# Patient Record
Sex: Female | Born: 1955 | Race: White | Hispanic: No | Marital: Married | Smoking: Current every day smoker
Health system: Southern US, Community
[De-identification: ages and names within clinical notes are randomized; demographics above are authoritative.]

## PROBLEM LIST (undated history)

## (undated) DIAGNOSIS — I10 Essential (primary) hypertension: Secondary | ICD-10-CM

## (undated) DIAGNOSIS — G629 Polyneuropathy, unspecified: Secondary | ICD-10-CM

## (undated) DIAGNOSIS — G4730 Sleep apnea, unspecified: Secondary | ICD-10-CM

## (undated) DIAGNOSIS — F419 Anxiety disorder, unspecified: Secondary | ICD-10-CM

## (undated) DIAGNOSIS — E119 Type 2 diabetes mellitus without complications: Secondary | ICD-10-CM

## (undated) HISTORY — PX: ABDOMINAL HYSTERECTOMY: SHX81

## (undated) HISTORY — PX: ROTATOR CUFF REPAIR: SHX139

---

## 1999-09-14 ENCOUNTER — Encounter: Payer: Self-pay | Admitting: *Deleted

## 1999-09-15 ENCOUNTER — Inpatient Hospital Stay (HOSPITAL_COMMUNITY): Admission: RE | Admit: 1999-09-15 | Discharge: 1999-09-17 | Payer: Self-pay | Admitting: *Deleted

## 1999-09-15 ENCOUNTER — Encounter (INDEPENDENT_AMBULATORY_CARE_PROVIDER_SITE_OTHER): Payer: Self-pay | Admitting: Specialist

## 2001-03-07 ENCOUNTER — Encounter: Payer: Self-pay | Admitting: Internal Medicine

## 2001-03-07 ENCOUNTER — Encounter: Admission: RE | Admit: 2001-03-07 | Discharge: 2001-03-07 | Payer: Self-pay | Admitting: Internal Medicine

## 2001-03-20 ENCOUNTER — Ambulatory Visit (HOSPITAL_BASED_OUTPATIENT_CLINIC_OR_DEPARTMENT_OTHER): Admission: RE | Admit: 2001-03-20 | Discharge: 2001-03-20 | Payer: Self-pay | Admitting: Geriatric Medicine

## 2001-08-26 ENCOUNTER — Other Ambulatory Visit: Admission: RE | Admit: 2001-08-26 | Discharge: 2001-08-26 | Payer: Self-pay | Admitting: *Deleted

## 2001-09-10 ENCOUNTER — Encounter: Payer: Self-pay | Admitting: Geriatric Medicine

## 2001-09-10 ENCOUNTER — Encounter: Admission: RE | Admit: 2001-09-10 | Discharge: 2001-09-10 | Payer: Self-pay | Admitting: Geriatric Medicine

## 2003-05-07 ENCOUNTER — Other Ambulatory Visit: Admission: RE | Admit: 2003-05-07 | Discharge: 2003-05-07 | Payer: Self-pay | Admitting: *Deleted

## 2010-06-21 ENCOUNTER — Ambulatory Visit (HOSPITAL_COMMUNITY): Admission: RE | Admit: 2010-06-21 | Discharge: 2010-06-21 | Payer: Self-pay | Admitting: Geriatric Medicine

## 2010-07-06 ENCOUNTER — Ambulatory Visit (HOSPITAL_COMMUNITY): Admission: RE | Admit: 2010-07-06 | Discharge: 2010-07-06 | Payer: Self-pay | Admitting: Geriatric Medicine

## 2012-06-03 ENCOUNTER — Encounter (HOSPITAL_COMMUNITY): Payer: Self-pay | Admitting: *Deleted

## 2012-06-03 ENCOUNTER — Emergency Department (HOSPITAL_COMMUNITY): Payer: Self-pay

## 2012-06-03 ENCOUNTER — Observation Stay (HOSPITAL_COMMUNITY)
Admission: EM | Admit: 2012-06-03 | Discharge: 2012-06-04 | Disposition: A | Discharge status: Home / Self Care | Payer: Self-pay | Attending: Internal Medicine | Admitting: Internal Medicine

## 2012-06-03 DIAGNOSIS — E669 Obesity, unspecified: Secondary | ICD-10-CM | POA: Insufficient documentation

## 2012-06-03 DIAGNOSIS — E119 Type 2 diabetes mellitus without complications: Secondary | ICD-10-CM | POA: Insufficient documentation

## 2012-06-03 DIAGNOSIS — I10 Essential (primary) hypertension: Secondary | ICD-10-CM | POA: Insufficient documentation

## 2012-06-03 DIAGNOSIS — R079 Chest pain, unspecified: Principal | ICD-10-CM

## 2012-06-03 HISTORY — DX: Anxiety disorder, unspecified: F41.9

## 2012-06-03 HISTORY — DX: Essential (primary) hypertension: I10

## 2012-06-03 HISTORY — DX: Polyneuropathy, unspecified: G62.9

## 2012-06-03 HISTORY — DX: Type 2 diabetes mellitus without complications: E11.9

## 2012-06-03 LAB — TROPONIN I
Troponin I: <0.3 ng/mL (ref <0.30)
Troponin I: <0.3 ng/mL (ref <0.30)
Troponin I: <0.3 ng/mL (ref <0.30)

## 2012-06-03 LAB — CBC WITH DIFFERENTIAL/PLATELET
Basophils Absolute: 0 10*3/uL (ref 0.0–0.1)
Basophils Relative: 1 % (ref 0–1)
Eosinophils Absolute: 0.2 10*3/uL (ref 0.0–0.7)
Eosinophils Relative: 2 % (ref 0–5)
HCT: 44.7 % (ref 36.0–46.0)
Hemoglobin: 15.5 g/dL — ABNORMAL HIGH (ref 12.0–15.0)
Lymphocytes Relative: 39 % (ref 12–46)
Lymphs Abs: 2.4 10*3/uL (ref 0.7–4.0)
MCH: 32.3 pg (ref 26.0–34.0)
MCHC: 34.7 g/dL (ref 30.0–36.0)
MCV: 93.1 fL (ref 78.0–100.0)
Monocytes Absolute: 0.4 10*3/uL (ref 0.1–1.0)
Monocytes Relative: 7 % (ref 3–12)
Neutro Abs: 3.3 10*3/uL (ref 1.7–7.7)
Neutrophils Relative %: 52 % (ref 43–77)
Platelets: 230 10*3/uL (ref 150–400)
RBC: 4.8 MIL/uL (ref 3.87–5.11)
RDW: 13.4 % (ref 11.5–15.5)
WBC: 6.3 10*3/uL (ref 4.0–10.5)

## 2012-06-03 LAB — BASIC METABOLIC PANEL
BUN: 17 mg/dL (ref 6–23)
CO2: 27 mEq/L (ref 19–32)
Calcium: 9.7 mg/dL (ref 8.4–10.5)
Chloride: 100 mEq/L (ref 96–112)
Creatinine, Ser: 0.77 mg/dL (ref 0.50–1.10)
GFR calc Af Amer: >90 mL/min (ref >90)
GFR calc non Af Amer: >90 mL/min (ref >90)
Glucose, Bld: 118 mg/dL — ABNORMAL HIGH (ref 70–99)
Potassium: 4 mEq/L (ref 3.5–5.1)
Sodium: 139 mEq/L (ref 135–145)

## 2012-06-03 LAB — GLUCOSE, CAPILLARY
Glucose-Capillary: 127 mg/dL — ABNORMAL HIGH (ref 70–99)
Glucose-Capillary: 108 mg/dL — ABNORMAL HIGH (ref 70–99)
Glucose-Capillary: 107 mg/dL — ABNORMAL HIGH (ref 70–99)

## 2012-06-03 IMAGING — CR DG CHEST 2V
3 series · 3 of 3 positions shown · non-contrast
Comparison: None.

CLINICAL DATA: Chest pain.

CHEST - 2 VIEW

[view not recorded (1 of 3)]
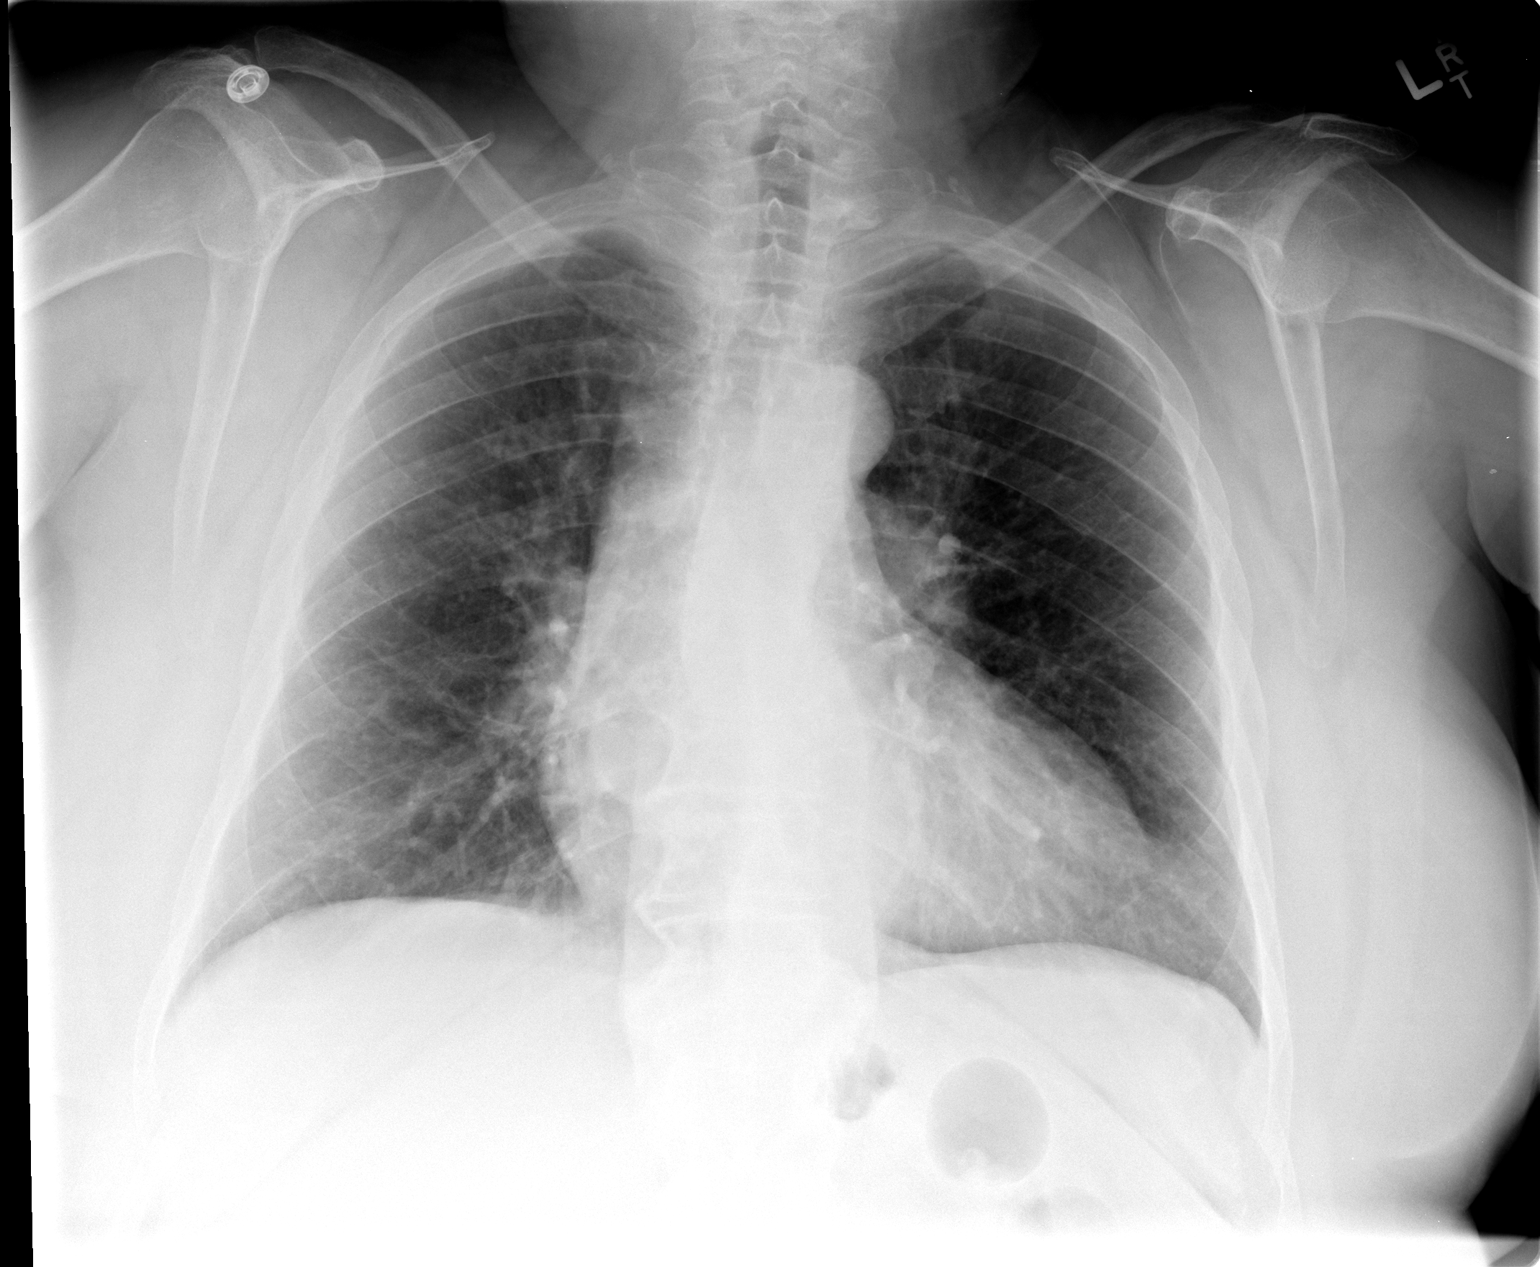

[view not recorded (2 of 3)]
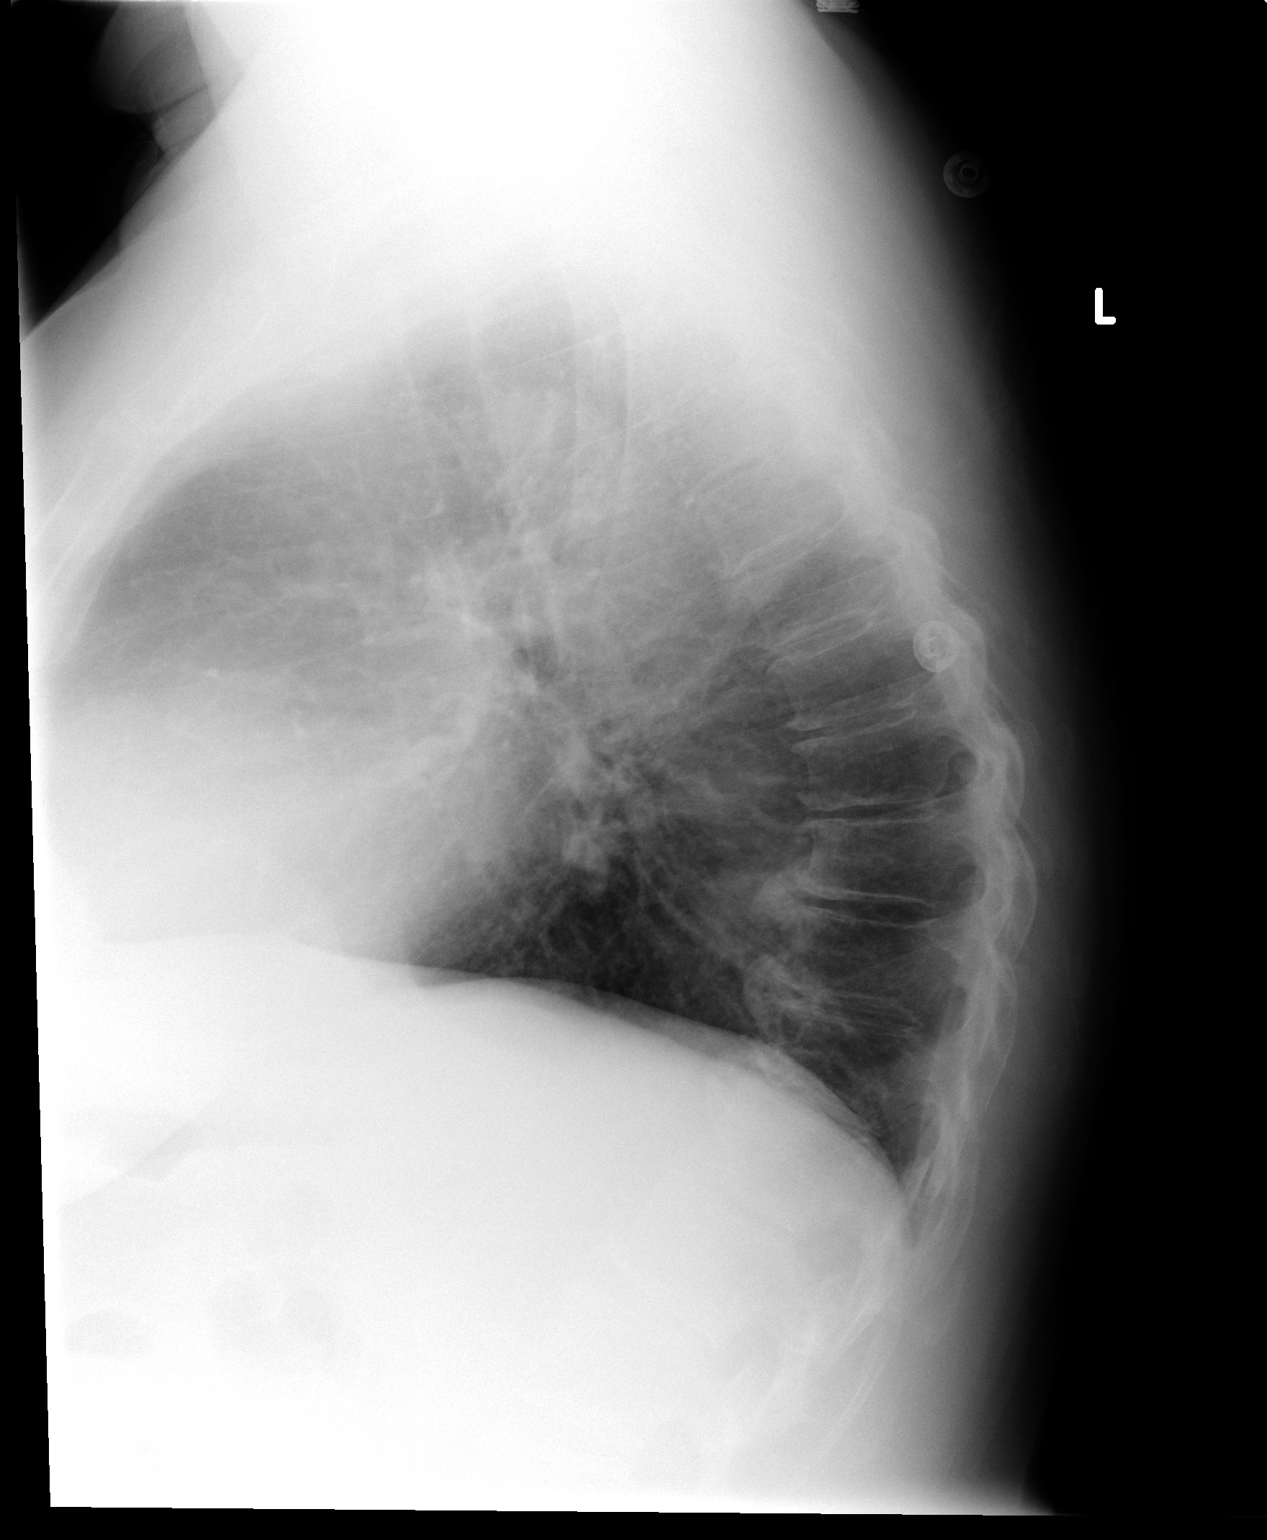

[view not recorded (3 of 3)]
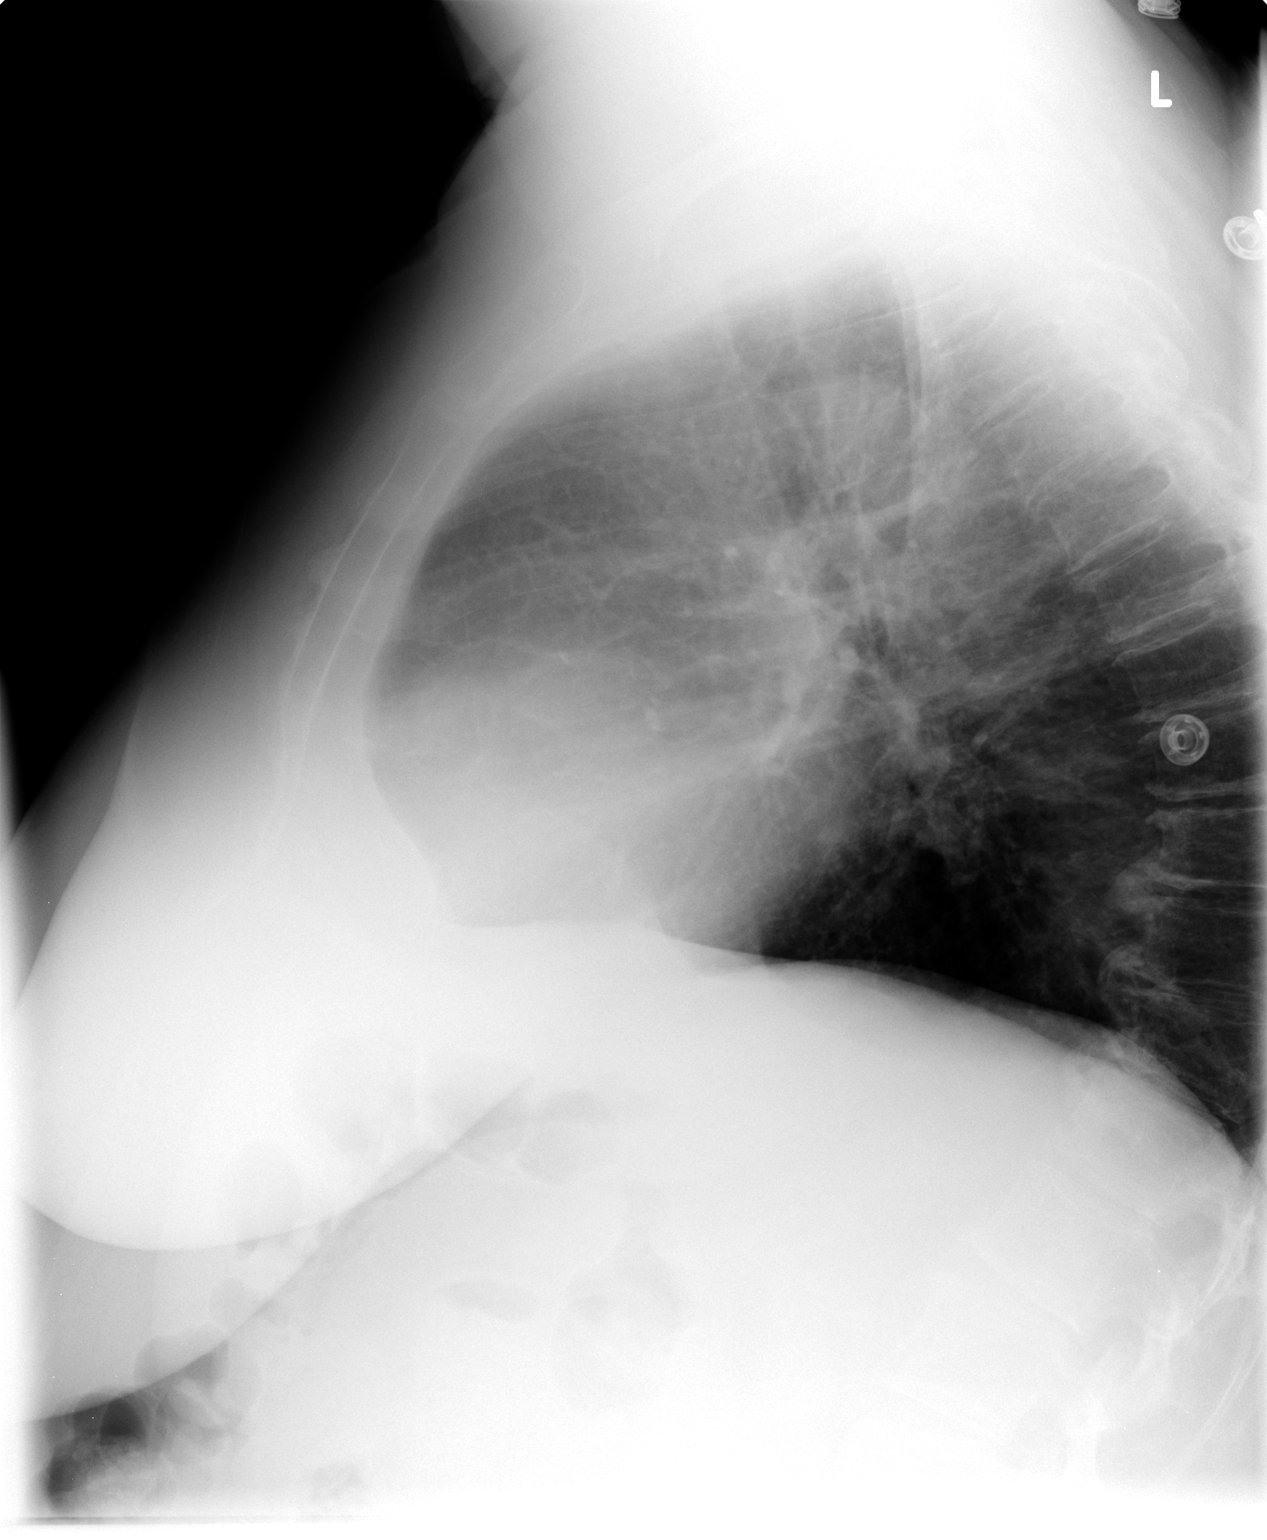

[3 of 3 positions shown; findings below may reference images not displayed]

FINDINGS: Cardiomediastinal silhouette appears normal.  No acute
pulmonary disease is noted.  Bony thorax is intact.
IMPRESSION: No acute cardiopulmonary abnormality seen.

## 2012-06-03 MED ORDER — ASPIRIN 81 MG PO CHEW: 81.0000 mg | CHEWABLE_TABLET | Freq: Every day | ORAL | Status: DC

## 2012-06-03 MED ORDER — GABAPENTIN 300 MG PO CAPS
300.0000 mg | ORAL_CAPSULE | Freq: Two times a day (BID) | ORAL | Status: DC
Start: 2012-06-03 — End: 2012-06-04
  Administered 2012-06-03 – 2012-06-04 (×3): 300 mg via ORAL
  Filled 2012-06-03 (×2): qty 1

## 2012-06-03 MED ORDER — ONDANSETRON HCL 4 MG/2ML IJ SOLN: 4.0000 mg | Freq: Four times a day (QID) | INTRAMUSCULAR | Status: DC | PRN

## 2012-06-03 MED ORDER — ONDANSETRON HCL 4 MG PO TABS: 4.0000 mg | ORAL_TABLET | Freq: Four times a day (QID) | ORAL | Status: DC | PRN

## 2012-06-03 MED ORDER — ASPIRIN 81 MG PO CHEW
243.0000 mg | CHEWABLE_TABLET | Freq: Once | ORAL | Status: AC
  Administered 2012-06-03: 243 mg via ORAL

## 2012-06-03 MED ORDER — TRAZODONE HCL 50 MG PO TABS
50.0000 mg | ORAL_TABLET | Freq: Every evening | ORAL | Status: DC | PRN
  Administered 2012-06-03: 50 mg via ORAL
  Filled 2012-06-03: qty 1

## 2012-06-03 MED ORDER — SODIUM CHLORIDE 0.9 % IJ SOLN
3.0000 mL | Freq: Two times a day (BID) | INTRAMUSCULAR | Status: DC
  Administered 2012-06-03: 3 mL via INTRAVENOUS
  Filled 2012-06-03: qty 3

## 2012-06-03 MED ORDER — HYDROCHLOROTHIAZIDE 25 MG PO TABS
25.0000 mg | ORAL_TABLET | Freq: Every day | ORAL | Status: DC
  Administered 2012-06-04: 25 mg via ORAL

## 2012-06-03 MED ORDER — METFORMIN HCL 500 MG PO TABS
1000.0000 mg | ORAL_TABLET | Freq: Two times a day (BID) | ORAL | Status: DC
  Administered 2012-06-03 – 2012-06-04 (×2): 1000 mg via ORAL
  Filled 2012-06-03: qty 2

## 2012-06-03 MED ORDER — CLONAZEPAM 0.5 MG PO TABS
1.0000 mg | ORAL_TABLET | Freq: Every day | ORAL | Status: DC
  Administered 2012-06-04: 1 mg via ORAL
  Filled 2012-06-03: qty 2

## 2012-06-03 MED ORDER — ASPIRIN 81 MG PO CHEW
324.0000 mg | CHEWABLE_TABLET | Freq: Once | ORAL | Status: DC
  Filled 2012-06-03: qty 4

## 2012-06-03 MED ORDER — METFORMIN HCL 500 MG PO TABS
1000.0000 mg | ORAL_TABLET | Freq: Two times a day (BID) | ORAL | Status: DC
  Filled 2012-06-03: qty 2

## 2012-06-03 MED ORDER — ASPIRIN EC 81 MG PO TBEC
81.0000 mg | DELAYED_RELEASE_TABLET | Freq: Every day | ORAL | Status: DC
  Administered 2012-06-04: 81 mg via ORAL

## 2012-06-03 MED ORDER — HEPARIN SODIUM (PORCINE) 5000 UNIT/ML IJ SOLN
5000.0000 [IU] | Freq: Three times a day (TID) | INTRAMUSCULAR | Status: DC
  Administered 2012-06-03: 5000 [IU] via SUBCUTANEOUS
  Filled 2012-06-03: qty 1

## 2012-06-03 NOTE — ED Notes (Signed)
Ice and peanut butter crackers given per request.

## 2012-06-03 NOTE — H&P (Addendum)
[facility] History and Physical  LAKEMA NAUERT ZOX:[id number] DOB: June 20, 1956 DOA: 06/03/2012    Chief Complaint: Chest pressure.  HPI: Christina Whitehead is a 56 y.o. female who describes chest pressure on 3 occasions this week. Each time the pressure has lasted approximately 20 minutes, radiating Whitehead the neck. It is not associated with nausea, sweating or dyspnea. When she got the pain the third time, she decided Whitehead come Whitehead the emergency room. He is a lady who is diabetic, hypertensive, obese and also a smoker. There is no significant family history of early coronary artery disease.   Review of Systems: Apart from history of present illness, other systems negative.  Past Medical History  Diagnosis Date  . Diabetes mellitus without complication   . Hypertension   . Neuropathy   . Anxiety    Past Surgical History  Procedure Date  . Abdominal hysterectomy    Social History:  She is married, lives with her husband. Her husband has recently lost his job and there has been significant stress related Whitehead this. Unfortunately, she still continues Whitehead smoke one half pack of cigarettes per day. He does not drink alcohol. There is no history of illicit drugs.  Allergies  Allergen Reactions  . Penicillins     rash    No family history on file. negative for early coronary artery disease.   Prior Whitehead Admission medications   Medication Sig Start Date End Date Taking? Authorizing Provider  aspirin 81 MG chewable tablet Chew 81 mg by mouth daily.   Yes Historical Provider, MD  clonazePAM (KLONOPIN) 1 MG tablet Take 1 mg by mouth daily.   Yes Historical Provider, MD  gabapentin (NEURONTIN) 300 MG capsule Take 300 mg by mouth 2 (two) times daily.   Yes Historical Provider, MD  hydrochlorothiazide (HYDRODIURIL) 25 MG tablet Take 25 mg by mouth daily.   Yes Historical Provider, MD  Melatonin 3 MG CAPS Take 1 capsule by mouth at bedtime as needed. Sleep.   Yes Historical Provider, MD    metFORMIN (GLUCOPHAGE) 1000 MG tablet Take 1,000 mg by mouth 2 (two) times daily with a meal.   Yes Historical Provider, MD   Physical Exam: Filed Vitals:   06/03/12 1037  BP: 134/83  Pulse: 89  Temp: 98.7 F (37.1 C)  TempSrc: Oral  Resp: 18  SpO2: 97%     General:  She looks systemically well, anxious.  Eyes: No pallor. No jaundice.  ENT: Within normal limits.  Neck: No lymphadenopathy.  Cardiovascular: Heart sounds present and normal without pericardial rub. There are no murmurs.  Respiratory: Lung fields are clear with the occasional wheeze which I think is chronic.  Abdomen: Soft, nontender. No hepatosplenomegaly. No masses.  Skin: No rash.  Musculoskeletal: No chest wall tenderness. No other joint problems.  Psychiatric: Anxious and tearful.  Neurologic: Alert and orientated without any focal neurological signs.  Labs on Admission:  Basic Metabolic Panel:  Lab 06/03/12 8119  NA 139  K 4.0  CL 100  CO2 27  GLUCOSE 118*  BUN 17  CREATININE 0.77  CALCIUM 9.7  MG --  PHOS --       CBC:  Lab 06/03/12 1040  WBC 6.3  NEUTROABS 3.3  HGB 15.5*  HCT 44.7  MCV 93.1  PLT 230   Cardiac Enzymes:  Lab 06/03/12 1040  CKTOTAL --  CKMB --  CKMBINDEX --  TROPONINI <0.30     CBG:  Lab 06/03/12 1230  GLUCAP 127*  Radiological Exams on Admission: Dg Chest 2 View  06/03/2012  *RADIOLOGY REPORT*  Clinical Data: Chest pain.  CHEST - 2 VIEW  Comparison: None.  Findings: Cardiomediastinal silhouette appears normal.  No acute pulmonary disease is noted.  Bony thorax is intact.  IMPRESSION: No acute cardiopulmonary abnormality seen.   Original Report Authenticated By: Venita Sheffield., M.D.     EKG: Independently reviewed. Normal sinus rhythm. No ST-T wave abnormalities.  Assessment/Plan   1. Chest pressure/pain, convincing for cardiac pain. Multiple risk factors. 2. Hypertension. 3. Type 2 diabetes mellitus. 4. Tobacco  abuse. 5. Obesity. Plan: 1. Admit Whitehead telemetry floor. 2. Serial cardiac enzymes. 3. Cardiology consultation. I think she will require a stress test.  Code Status: Full code. Family Communication: Discussed by the patient at the bedside.  Disposition Plan: Home when medically stable.   Time spent: 45 minutes.  Wilson Singer [facility] Pager [phone].  If 7PM-7AM, please contact night-coverage [email or web address] Password [email or web address] 06/03/2012, 12:43 PM

## 2012-06-03 NOTE — ED Provider Notes (Addendum)
History   This chart was scribed for Christina Sprout, MD by Gerlean Ren. This patient was seen in room APA08/AP[facility]08 and the patient's care was started at 10:10.   CSN: [id number]  Arrival date & time 06/03/12  4098   First MD Initiated Contact with Patient 06/03/12 1006      Chief Complaint  Patient presents with  . Chest Pain    (Consider location/radiation/quality/duration/timing/severity/associated sxs/prior treatment) The history is provided by the patient. No language interpreter was used.   ANELA BENSMAN is a 56 y.o. female who presents to the Emergency Department complaining of 3 episodes of gradual onset chest tightness lasting 20-30 minutes that begins during normal activity and is not improved or worsened by any specific factors.  Pt denies fever, neck pain, sore throat, visual disturbance, cough, dyspnea, abdominal pain, nausea, emesis, diarrhea, urinary symptoms, back pain, HA, weakness, numbness and rash as associated with episodes.  Pt reports being under a lot of stress recently.  Pt has a h/o HTN, DM, and anxiety.  Pt is a current everyday smoker but denies alcohol use.    Past Medical History  Diagnosis Date  . Diabetes mellitus without complication   . Hypertension   . Neuropathy   . Anxiety     Past Surgical History  Procedure Date  . Abdominal hysterectomy     No family history on file.  History  Substance Use Topics  . Smoking status: Current Every Day Smoker  . Smokeless tobacco: Not on file  . Alcohol Use: No    No OB history provided.  Review of Systems A complete 10 system review of systems was obtained and all systems are negative except as noted in the HPI and PMH.   Allergies  Penicillins  Home Medications  No current outpatient prescriptions on file.  There were no vitals taken for this visit.  Physical Exam  Nursing note and vitals reviewed. Constitutional: She is oriented to person, place, and time. She appears well-developed  and well-nourished.  HENT:  Head: Normocephalic and atraumatic.  Eyes: Conjunctivae normal and EOM are normal.  Neck: Normal range of motion. No tracheal deviation present.  Cardiovascular: Normal rate and regular rhythm.   No murmur heard. Pulmonary/Chest: Breath sounds normal. She has no wheezes.  Abdominal: Soft. She exhibits no distension. There is no tenderness.  Musculoskeletal: Normal range of motion. She exhibits no edema.  Neurological: She is alert and oriented to person, place, and time.  Skin: Skin is warm.  Psychiatric: She has a normal mood and affect.    ED Course  Procedures (including critical care time) DIAGNOSTIC STUDIES: No O2 stat taken.   COORDINATION OF CARE: 10:16- Patient informed of clinical course, understands medical decision-making process, and agrees with plan.    Labs Reviewed  CBC WITH DIFFERENTIAL - Abnormal; Notable for the following:    Hemoglobin 15.5 (*)     All other components within normal limits  BASIC METABOLIC PANEL - Abnormal; Notable for the following:    Glucose, Bld 118 (*)     All other components within normal limits  TROPONIN I   Dg Chest 2 View  06/03/2012  *RADIOLOGY REPORT*  Clinical Data: Chest pain.  CHEST - 2 VIEW  Comparison: None.  Findings: Cardiomediastinal silhouette appears normal.  No acute pulmonary disease is noted.  Bony thorax is intact.  IMPRESSION: No acute cardiopulmonary abnormality seen.   Original Report Authenticated By: Venita Sheffield., M.D.      Date:  06/03/2012  Rate: 72  Rhythm: normal sinus rhythm  QRS Axis: normal  Intervals: normal  ST/T Wave abnormalities: normal  Conduction Disutrbances: none  Narrative Interpretation: unremarkable     1. Chest pain       MDM   Pt with symptoms concerning for ACS.  TIMI 2 for risk factors, multiple episodes. No associated symptoms.  States that she has been under a lot of stress recently.  Low concern for PE at this time due to hx and risk  factors. ASA given. Patient is not currently having chest pain EKG unremarkable, CXR, CBC, BMP, CE pending.  11:50 AM Feel pt needs a r/o due to above sx and risk factors.  Will discuss with hospitalist.  I personally performed the services described in this documentation, which was scribed in my presence.  The recorded information has been reviewed and considered.         Christina Sprout, MD 06/03/12 1050  Christina Sprout, MD 06/03/12 1151  Christina Sprout, MD 06/03/12 1206

## 2012-06-03 NOTE — ED Notes (Addendum)
Pt c/o "pressure" or "gas" sensation that starts at the bottom of her chest under bilateral breast radiates up to bilateral underarm area and then continues to travel to the jaw area. Pt states that the pressure sensastion comes and goes, is not associated with n,/v, sob, diaphoresis when the pressure happens. Pt states that she has been having problems with stress at home due to husband losing his job, has been dealing with depression but denies any SI/HI, admits to having problems with sleep.

## 2012-06-04 LAB — CBC
HCT: 45.6 % (ref 36.0–46.0)
Hemoglobin: 15.5 g/dL — ABNORMAL HIGH (ref 12.0–15.0)
MCH: 32 pg (ref 26.0–34.0)
MCHC: 34 g/dL (ref 30.0–36.0)
MCV: 94 fL (ref 78.0–100.0)
Platelets: 236 10*3/uL (ref 150–400)
RBC: 4.85 MIL/uL (ref 3.87–5.11)
RDW: 13.5 % (ref 11.5–15.5)
WBC: 6.9 10*3/uL (ref 4.0–10.5)

## 2012-06-04 LAB — COMPREHENSIVE METABOLIC PANEL
ALT: 19 U/L (ref 0–35)
AST: 17 U/L (ref 0–37)
Albumin: 3.8 g/dL (ref 3.5–5.2)
Alkaline Phosphatase: 92 U/L (ref 39–117)
BUN: 17 mg/dL (ref 6–23)
CO2: 28 mEq/L (ref 19–32)
Calcium: 9.6 mg/dL (ref 8.4–10.5)
Chloride: 99 mEq/L (ref 96–112)
Creatinine, Ser: 0.82 mg/dL (ref 0.50–1.10)
GFR calc Af Amer: >90 mL/min (ref >90)
GFR calc non Af Amer: 78 mL/min — ABNORMAL LOW (ref >90)
Glucose, Bld: 170 mg/dL — ABNORMAL HIGH (ref 70–99)
Potassium: 4 mEq/L (ref 3.5–5.1)
Sodium: 138 mEq/L (ref 135–145)
Total Bilirubin: 0.6 mg/dL (ref 0.3–1.2)
Total Protein: 6.7 g/dL (ref 6.0–8.3)

## 2012-06-04 LAB — HEMOGLOBIN A1C
Hgb A1c MFr Bld: 6.3 % — ABNORMAL HIGH (ref <5.7)
Mean Plasma Glucose: 134 mg/dL — ABNORMAL HIGH (ref <117)

## 2012-06-04 LAB — GLUCOSE, CAPILLARY: Glucose-Capillary: 134 mg/dL — ABNORMAL HIGH (ref 70–99)

## 2012-06-04 LAB — TSH: TSH: 1.891 u[IU]/mL (ref 0.350–4.500)

## 2012-06-04 NOTE — Progress Notes (Signed)
UR Chart Review Completed  

## 2012-06-04 NOTE — Discharge Summary (Signed)
Physician Discharge Summary  Christina Whitehead ZOX:[id number] DOB: 22-Oct-1955 DOA: 06/03/2012   Admit date: 06/03/2012 Discharge date: 06/04/2012  Recommendations for Outpatient Follow-up:  1. Outpatient stress test followed by cardiology appointment.   Discharge Diagnoses: 1. Chest pain, possible cardiac in origin. No evidence of acute ischemia or infarction. 2. Hypertension. 3. Type 2 diabetes mellitus. 4. Obesity.    Discharge Condition: Stable.  Diet recommendation: Carbohydrate modified diet.    History of present illness:  This 56 year old lady presents to the hospital with chest pressure. Please see initial history as outlined below. HPI: Christina Whitehead is a 56 y.o. female who describes chest pressure on 3 occasions this week. Each time the pressure has lasted approximately 20 minutes, radiating to the neck. It is not associated with nausea, sweating or dyspnea. When she got the pain the third time, she decided to come to the emergency room. He is a lady who is diabetic, hypertensive, obese and also a smoker. There is no significant family history of early coronary artery disease.  Hospital Course:  Patient was admitted overnight and serial cardiac enzymes were negative. Also serial electrocardiograms were unremarkable. She had no further chest pain. I discussed the case with cardiology, Dr. Diona Browner, who felt that it was appropriate that the patient have an outpatient stress test. We will now range this and she will then see cardiology team.  Procedures:  None.   Consultations:  None.  Discharge Exam: Filed Vitals:   06/03/12 1257 06/03/12 1436 06/03/12 2106 06/04/12 0542  BP: 127/75 138/86 120/80 118/81  Pulse: 68 65 67 66  Temp: 97.8 F (36.6 C) 97.4 F (36.3 C) 98.2 F (36.8 C) 97.4 F (36.3 C)  TempSrc: Oral Oral Oral Oral  Resp: 18 18 20 20   SpO2: 95% 100% 96% 97%    General: She looks systemically well. Cardiovascular: Heart sounds are present and  normal without gallop rhythm. There is no pericardial rub. There are no murmurs. Respiratory: Lung fields are clear. She is alert and orientated without any focal neurological signs.  Discharge Instructions  Discharge Orders    Future Appointments: Provider: Department: Dept Phone: Center:   06/07/2012 11:45 AM [facility] Nilsa [facility]ting [phone] [facility]     Future Orders Please Complete By Expires   Diet - low sodium heart healthy      Increase activity slowly          Medication List     As of 06/04/2012 11:01 AM    TAKE these medications         aspirin 81 MG chewable tablet   Chew 81 mg by mouth daily.      clonazePAM 1 MG tablet   Commonly known as: KLONOPIN   Take 1 mg by mouth daily.      gabapentin 300 MG capsule   Commonly known as: NEURONTIN   Take 300 mg by mouth 2 (two) times daily.      hydrochlorothiazide 25 MG tablet   Commonly known as: HYDRODIURIL   Take 25 mg by mouth daily.      Melatonin 3 MG Caps   Take 1 capsule by mouth at bedtime as needed. Sleep.      metFORMIN 1000 MG tablet   Commonly known as: GLUCOPHAGE   Take 1,000 mg by mouth 2 (two) times daily with a meal.          The results of significant diagnostics from this hospitalization (including imaging, microbiology, ancillary and laboratory) are  listed below for reference.    Significant Diagnostic Studies: Dg Chest 2 View  06/03/2012  *RADIOLOGY REPORT*  Clinical Data: Chest pain.  CHEST - 2 VIEW  Comparison: None.  Findings: Cardiomediastinal silhouette appears normal.  No acute pulmonary disease is noted.  Bony thorax is intact.  IMPRESSION: No acute cardiopulmonary abnormality seen.   Original Report Authenticated By: Venita Sheffield., M.D.         Labs: Basic Metabolic Panel:  Lab 06/04/12 1610 06/03/12 1040  NA 138 139  K 4.0 4.0  CL 99 100  CO2 28 27  GLUCOSE 170* 118*  BUN 17 17  CREATININE 0.82 0.77  CALCIUM 9.6 9.7  MG -- --  PHOS --  --   Liver Function Tests:  Lab 06/04/12 0443  AST 17  ALT 19  ALKPHOS 92  BILITOT 0.6  PROT 6.7  ALBUMIN 3.8     CBC:  Lab 06/04/12 0443 06/03/12 1040  WBC 6.9 6.3  NEUTROABS -- 3.3  HGB 15.5* 15.5*  HCT 45.6 44.7  MCV 94.0 93.1  PLT 236 230   Cardiac Enzymes:  Lab 06/03/12 1944 06/03/12 1341 06/03/12 1040  CKTOTAL -- -- --  CKMB -- -- --  CKMBINDEX -- -- --  TROPONINI <0.30 <0.30 <0.30     CBG:  Lab 06/04/12 0716 06/03/12 2103 06/03/12 1647 06/03/12 1230  GLUCAP 134* 107* 108* 127*    Time coordinating discharge: *Less than 30 minutes  Signed:  GOSRANI,NIMISH C  [facility] 06/04/2012, 11:01 AM

## 2012-06-04 NOTE — Progress Notes (Signed)
Patient was complaining of not being able to sleep. Doctor was notified and new orders were given.

## 2012-06-04 NOTE — Progress Notes (Signed)
Patient received discharge instructions along with follow up appointments and prescriptions. Patient verbalized understanding of all instructions. Patient was escorted by staff to vehicle. Patient discharged to home in stable condition. 

## 2012-06-06 ENCOUNTER — Other Ambulatory Visit: Payer: Self-pay | Admitting: *Deleted

## 2012-06-06 DIAGNOSIS — R079 Chest pain, unspecified: Secondary | ICD-10-CM

## 2012-06-07 ENCOUNTER — Encounter (HOSPITAL_COMMUNITY): Payer: Self-pay | Admitting: Internal Medicine

## 2012-06-07 ENCOUNTER — Encounter (HOSPITAL_COMMUNITY)
Admission: RE | Admit: 2012-06-07 | Discharge: 2012-06-07 | Disposition: A | Discharge status: Home / Self Care | Payer: Self-pay | Source: Ambulatory Visit | Attending: Cardiology | Admitting: Cardiology

## 2012-06-07 ENCOUNTER — Encounter (HOSPITAL_COMMUNITY): Payer: Self-pay

## 2012-06-07 ENCOUNTER — Ambulatory Visit (HOSPITAL_COMMUNITY)
Admit: 2012-06-07 | Discharge: 2012-06-07 | Disposition: A | Discharge status: Home / Self Care | Payer: Self-pay | Source: Ambulatory Visit | Attending: Cardiology | Admitting: Cardiology

## 2012-06-07 DIAGNOSIS — E119 Type 2 diabetes mellitus without complications: Secondary | ICD-10-CM | POA: Insufficient documentation

## 2012-06-07 DIAGNOSIS — R079 Chest pain, unspecified: Secondary | ICD-10-CM | POA: Insufficient documentation

## 2012-06-07 DIAGNOSIS — I10 Essential (primary) hypertension: Secondary | ICD-10-CM | POA: Insufficient documentation

## 2012-06-07 IMAGING — NM NM MYOCAR SINGLE W/SPECT W/WALL MOTION & EF
2 series · 12 of 12 positions shown · non-contrast
Comparison: none

Identification:  The patient is a 56-year-old with history of chest
pain.  Test to evaluate, rule out ischemia.

Stress data:  The patient exercised in the Bruce protocol baseline
EKG showed sinus rhythm 76 beats per minute baseline blood pressure
102/80.  The patient exercised for 7 minutes 5 seconds to a peak
heart rate of 146 which was 89% predicted maximal, peak blood
pressure 171/95.  The the patient experienced no chest pain EKG
showed no ST changes to suggest ischemia.
Nuclear data:  The patient was studied in 1-day rest stress
protocol she was injected with 10.5 mCi technetium 99 labeled
tetrofosmin at rest, 30 mCi technetium 99 labeled tetrofosmin at
stress.  Images were reconstructed in the short, vertical,
horizontal axes.
In both rest and stress images there appeared to be normal
perfusion.
On gating LVEF was calculated at greater than 70%.

[cardiac rest stress · 6.39mm/px · 6 of 512 frames shown (1 of 2)]
[frame 43/512]
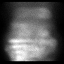
[frame 128/512]
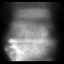
[frame 214/512]
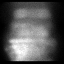
[frame 299/512]
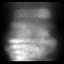
[frame 384/512]
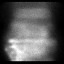
[frame 470/512]
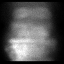

[cardiac rest stress · 6.39mm/px · 6 of 64 frames shown (2 of 2)]
[frame 6/64]
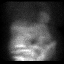
[frame 16/64]
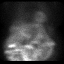
[frame 27/64]
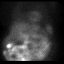
[frame 38/64]
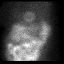
[frame 48/64]
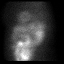
[frame 59/64]
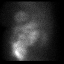

[12 of 12 positions shown; findings below may reference images not displayed]

IMPRESSION: Exercise Myoview.  Clinically negative, electrically
negative for ischemia.  Myoview scan with normal perfusion.  LVEF
greater than 70% with normal wall motion.

## 2012-06-07 MED ORDER — SODIUM CHLORIDE 0.9 % IJ SOLN
INTRAMUSCULAR | Status: AC
  Administered 2012-06-07: 10 mL via INTRAVENOUS
  Filled 2012-06-07: qty 10

## 2012-06-07 MED ORDER — TECHNETIUM TC 99M SESTAMIBI - CARDIOLITE
30.0000 | Freq: Once | INTRAVENOUS | Status: AC | PRN
  Administered 2012-06-07: 12:00:00 30 via INTRAVENOUS

## 2012-06-07 MED ORDER — TECHNETIUM TC 99M SESTAMIBI - CARDIOLITE
10.0000 | Freq: Once | INTRAVENOUS | Status: AC | PRN
  Administered 2012-06-07: 10:00:00 10.5 via INTRAVENOUS

## 2012-06-07 NOTE — Progress Notes (Signed)
Stress Lab Nurses Notes - Christina Whitehead  Christina Whitehead 06/07/2012 Reason for doing test: Chest Pain Type of test: Stress Cardiolite Nurse performing test: Parke Poisson, RN Nuclear Medicine Tech: Lou Cal Echo Tech: Not Applicable MD performing test: Lovina Reach Family MD: Karilyn Cota Test explained and consent signed: yes IV started: 22g jelco, Saline lock flushed, No redness or edema and Saline lock started in radiology Symptoms: SOB Treatment/Intervention: None Reason test stopped: fatigue and SOB After recovery IV was: Discontinued via X-ray tech and No redness or edema Patient to return to Nuc. Med at : 12:45 Patient discharged: Home Patient's Condition upon discharge was: stable Comments: During test peak BP 171/95 & HR 142.  Recovery BP 112/65 & HR 99.  Symptoms resolved in recovery. [name] T

## 2012-06-10 ENCOUNTER — Encounter (HOSPITAL_COMMUNITY): Payer: Self-pay

## 2012-06-10 ENCOUNTER — Ambulatory Visit (HOSPITAL_COMMUNITY): Payer: Self-pay

## 2012-06-13 ENCOUNTER — Telehealth: Payer: Self-pay | Admitting: Cardiology

## 2012-06-13 NOTE — Telephone Encounter (Signed)
Patient called, she is not established with [place], was seen in the ER for CP.  Had a stress test on Friday.  She has not received the results of the stress test.  Appointment has not be made with [place] for patient.  Please advise if patient should contact primary physician for these results?

## 2012-06-13 NOTE — Telephone Encounter (Signed)
Appointment made for post hospital follow up, as indicated in Discharge summary.  Will discuss results and any further plan at that time.

## 2012-06-26 ENCOUNTER — Ambulatory Visit (INDEPENDENT_AMBULATORY_CARE_PROVIDER_SITE_OTHER): Payer: Self-pay | Admitting: Physician Assistant

## 2012-06-26 ENCOUNTER — Encounter: Payer: Self-pay | Admitting: Physician Assistant

## 2012-06-26 VITALS — BP 143/91 | HR 95 | Ht 65.0 in | Wt 234.0 lb

## 2012-06-26 DIAGNOSIS — E119 Type 2 diabetes mellitus without complications: Secondary | ICD-10-CM

## 2012-06-26 DIAGNOSIS — I10 Essential (primary) hypertension: Secondary | ICD-10-CM

## 2012-06-26 DIAGNOSIS — R079 Chest pain, unspecified: Secondary | ICD-10-CM | POA: Insufficient documentation

## 2012-06-26 DIAGNOSIS — E1143 Type 2 diabetes mellitus with diabetic autonomic (poly)neuropathy: Secondary | ICD-10-CM | POA: Insufficient documentation

## 2012-06-26 DIAGNOSIS — F17200 Nicotine dependence, unspecified, uncomplicated: Secondary | ICD-10-CM

## 2012-06-26 DIAGNOSIS — Z720 Tobacco use: Secondary | ICD-10-CM

## 2012-06-26 DIAGNOSIS — E669 Obesity, unspecified: Secondary | ICD-10-CM

## 2012-06-26 MED ORDER — HYDROCHLOROTHIAZIDE 25 MG PO TABS: 25.0000 mg | ORAL_TABLET | Freq: Every day | ORAL | Status: AC

## 2012-06-26 NOTE — Assessment & Plan Note (Signed)
Weight loss recommended 

## 2012-06-26 NOTE — Assessment & Plan Note (Signed)
Patient's blood pressure is up today. She ran out of her hydrochlorothiazide last week. We will renew this prescription. 2 g sodium diet recommended. Referred to the free clinic.

## 2012-06-26 NOTE — Assessment & Plan Note (Signed)
Smoking cessation discussed 

## 2012-06-26 NOTE — Assessment & Plan Note (Signed)
Patient denies any further chest pain. I think most of her problems are due to stress. Stress Myoview was negative for ischemia on 06/06/12. We will refer her to the free clinic because she has no primary medical doctor and has no insurance since her husband lost his job.

## 2012-06-26 NOTE — Progress Notes (Signed)
HPI:  This is a 56 year old female patient who was seen by Dr. Diona Browner in [facility] hospital for chest pain. She ruled out for an MI an outpatient stress test was recommended. Stress test was performed on 06/06/12 and showed no evidence of ischemia.  The patient is under extreme amount of stress because her husband lost his job and he no longer have insurance. 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 She ran out of her hydrochlorothiazide last week. She denies any further chest pain, palpitations, dyspnea, dizziness, or presyncope. She continues to smoke a pack of cigarettes a day. This is down from 2 packs daily. She also has hypertension and diabetes mellitus.   Allergies: -- Penicillins    --  rash  Current Outpatient Prescriptions on File Prior to Visit: aspirin 81 MG chewable tablet, Chew 81 mg by mouth daily., Disp: , Rfl:  clonazePAM (KLONOPIN) 1 MG tablet, Take 1 mg by mouth 2 (two) times daily as needed. , Disp: , Rfl:  gabapentin (NEURONTIN) 300 MG capsule, Take 300 mg by mouth 2 (two) times daily., Disp: , Rfl:  metFORMIN (GLUCOPHAGE) 1000 MG tablet, Take 1,000 mg by mouth 2 (two) times daily with a meal., Disp: , Rfl:  [DISCONTINUED] hydrochlorothiazide (HYDRODIURIL) 25 MG tablet, Take 25 mg by mouth daily., Disp: , Rfl:     Past Medical History:   Diabetes mellitus without complication                       Hypertension                                                 Neuropathy                                                   Anxiety                                                     Past Surgical History:   ABDOMINAL HYSTERECTOMY                                      No family history on file.   Social History   Marital Status: Married             Spouse Name:                      Years of Education:                 Number of children:             Occupational History   None on file  Social History Main Topics   Smoking Status: Current  Every Day Smoker        Packs/Day: 1.5   Years: 40        Types: Cigarettes   Smokeless Status: Not on file  Alcohol Use: No             Drug Use: No             Sexual Activity:                    Other Topics            Concern   None on file  Social History Narrative   None on file    ROS:see history of present illness otherwise negative   PHYSICAL EXAM: Obese, in no acute distress. Neck: No JVD, HJR, Bruit, or thyroid enlargement  Lungs: decreased breath sounds throughout,No tachypnea, clear without wheezing, rales, or rhonchi  Cardiovascular: RRR, PMI not displaced, heart sounds normal, no murmurs, gallops, bruit, thrill, or heave.  Abdomen: BS normal. Soft without organomegaly, masses, lesions or tenderness.  Extremities: without cyanosis, clubbing or edema. Good distal pulses bilateral  SKin: Warm, no lesions or rashes   Musculoskeletal: No deformities  Neuro: no focal signs  BP 143/91  Pulse 95  Ht 5\' 5"  (1.651 m)  Wt 234 lb (106.142 kg)  BMI 38.94 kg/m2  SpO2 96%     Stress myoview 06/06/12 On gating LVEF was calculated at greater than 70%.  Impression:  Exercise Myoview.  Clinically negative, electrically negative for ischemia.  Myoview scan with normal perfusion.  LVEF greater than 70% with normal wall motion

## 2012-06-26 NOTE — Patient Instructions (Signed)
Continue taking your medication as prescribed. 

## 2013-03-04 ENCOUNTER — Other Ambulatory Visit (HOSPITAL_COMMUNITY): Payer: Self-pay | Admitting: Nurse Practitioner

## 2013-03-04 DIAGNOSIS — Z139 Encounter for screening, unspecified: Secondary | ICD-10-CM

## 2013-03-10 ENCOUNTER — Ambulatory Visit (HOSPITAL_COMMUNITY): Payer: Self-pay

## 2013-04-24 ENCOUNTER — Ambulatory Visit (HOSPITAL_COMMUNITY)
Admission: RE | Admit: 2013-04-24 | Discharge: 2013-04-24 | Disposition: A | Discharge status: Home / Self Care | Payer: Self-pay | Source: Ambulatory Visit | Attending: Nurse Practitioner | Admitting: Nurse Practitioner

## 2013-04-24 DIAGNOSIS — Z139 Encounter for screening, unspecified: Secondary | ICD-10-CM

## 2017-03-04 ENCOUNTER — Encounter (HOSPITAL_COMMUNITY): Payer: Self-pay | Admitting: Emergency Medicine

## 2017-03-04 ENCOUNTER — Emergency Department (HOSPITAL_COMMUNITY)
Admission: EM | Admit: 2017-03-04 | Discharge: 2017-03-04 | Disposition: A | Discharge status: Home / Self Care | Payer: Self-pay | Attending: Emergency Medicine | Admitting: Emergency Medicine

## 2017-03-04 DIAGNOSIS — T7840XA Allergy, unspecified, initial encounter: Secondary | ICD-10-CM | POA: Insufficient documentation

## 2017-03-04 DIAGNOSIS — F17210 Nicotine dependence, cigarettes, uncomplicated: Secondary | ICD-10-CM | POA: Insufficient documentation

## 2017-03-04 DIAGNOSIS — E119 Type 2 diabetes mellitus without complications: Secondary | ICD-10-CM | POA: Insufficient documentation

## 2017-03-04 DIAGNOSIS — I10 Essential (primary) hypertension: Secondary | ICD-10-CM | POA: Insufficient documentation

## 2017-03-04 DIAGNOSIS — Z7984 Long term (current) use of oral hypoglycemic drugs: Secondary | ICD-10-CM | POA: Insufficient documentation

## 2017-03-04 DIAGNOSIS — Z7982 Long term (current) use of aspirin: Secondary | ICD-10-CM | POA: Insufficient documentation

## 2017-03-04 MED ORDER — PREDNISOLONE 15 MG/5ML PO SYRP
15.0000 mg | ORAL_SOLUTION | Freq: Every day | ORAL | 0 refills | Status: AC
Start: 2017-03-04 — End: 2017-03-09

## 2017-03-04 MED ORDER — SODIUM CHLORIDE 0.9 % IV BOLUS (SEPSIS)
1000.0000 mL | Freq: Once | INTRAVENOUS | Status: AC
  Administered 2017-03-04: 1000 mL via INTRAVENOUS

## 2017-03-04 MED ORDER — FAMOTIDINE IN NACL 20-0.9 MG/50ML-% IV SOLN
20.0000 mg | Freq: Once | INTRAVENOUS | Status: AC
  Administered 2017-03-04: 20 mg via INTRAVENOUS
  Filled 2017-03-04: qty 50

## 2017-03-04 MED ORDER — METHYLPREDNISOLONE SODIUM SUCC 125 MG IJ SOLR
125.0000 mg | Freq: Once | INTRAMUSCULAR | Status: AC
  Administered 2017-03-04: 125 mg via INTRAVENOUS
  Filled 2017-03-04: qty 2

## 2017-03-04 MED ORDER — DIPHENHYDRAMINE HCL 50 MG/ML IJ SOLN
25.0000 mg | Freq: Once | INTRAMUSCULAR | Status: AC
  Administered 2017-03-04: 25 mg via INTRAVENOUS
  Filled 2017-03-04: qty 1

## 2017-03-04 MED ORDER — PREDNISONE 10 MG PO TABS: 20.0000 mg | ORAL_TABLET | Freq: Every day | ORAL | 0 refills | Status: DC

## 2017-03-04 NOTE — ED Provider Notes (Signed)
[facility] DEPT Provider Note   CSN: [id number] Arrival date & time: 03/04/17  1212     History   Chief Complaint Chief Complaint  Patient presents with  . Allergic Reaction    HPI Christina Whitehead is a [age] y.o. female.  Patient presents with generalized pruritus, rash, lip swelling since Friday. She has tried Benadryl with minimal relief. New medications include trazodone approximate 30 days ago. She has been on lisinopril for approximately one year. No trouble swallowing or breathing. Severity symptoms is moderate.      Past Medical History:  Diagnosis Date  . Anxiety   . Diabetes mellitus without complication (HCC)   . Hypertension   . Neuropathy     Patient Active Problem List   Diagnosis Date Noted  . Chest pain 06/26/2012  . Hypertension 06/26/2012  . Type 2 diabetes mellitus (HCC) 06/26/2012  . Obesity 06/26/2012  . Tobacco abuse 06/26/2012    Past Surgical History:  Procedure Laterality Date  . ABDOMINAL HYSTERECTOMY    . CESAREAN SECTION     x2    OB History    Gravida Para Term Preterm AB Living   2 2 2     2    SAB TAB Ectopic Multiple Live Births                   Home Medications    Prior to Admission medications   Medication Sig Start Date End Date Taking? Authorizing Provider  aspirin 81 MG chewable tablet Chew 81 mg by mouth daily.   Yes [provider]  gabapentin (NEURONTIN) 300 MG capsule Take 300 mg by mouth 2 (two) times daily.    [provider]  hydrochlorothiazide (HYDRODIURIL) 25 MG tablet Take 1 tablet (25 mg total) by mouth daily. 06/26/12   Dyann KiefLenze, Michele M, PA-C  metFORMIN (GLUCOPHAGE) 1000 MG tablet Take 1,000 mg by mouth 2 (two) times daily with a meal.    [provider]  prednisoLONE (PRELONE) 15 MG/5ML syrup Take 5 mLs (15 mg total) by mouth daily. 03/04/17 03/09/17  Donnetta Hutchingook, Nike Southwell, MD  predniSONE (DELTASONE) 10 MG tablet Take 2 tablets (20 mg total) by mouth daily. 03/04/17   Donnetta Hutchingook, Destina Mantei, MD     Family History No family history on file.  Social History Social History  Substance Use Topics  . Smoking status: Current Every Day Smoker    Packs/day: 1.50    Years: 40.00    Types: Cigarettes  . Smokeless tobacco: Never Used  . Alcohol use No     Allergies   Penicillins   Review of Systems Review of Systems  All other systems reviewed and are negative.    Physical Exam Updated Vital Signs BP 130/84 (BP Location: Right Arm)   Pulse 92   Temp 98.2 F (36.8 C) (Oral)   Resp 20   Ht 5\' 5"  (1.651 m)   Wt 90.7 kg (200 lb)   SpO2 98%   BMI 33.28 kg/m   Physical Exam  Constitutional: She is oriented to person, place, and time. She appears well-developed and well-nourished.  HENT:  Head: Atraumatic.  Minimal lower lip swelling  Eyes: Conjunctivae are normal.  Neck: Neck supple.  Cardiovascular: Normal rate and regular rhythm.   Pulmonary/Chest: Effort normal and breath sounds normal.  Abdominal: Soft. Bowel sounds are normal.  Musculoskeletal: Normal range of motion.  Neurological: She is alert and oriented to person, place, and time.  Skin:  Diffuse wheals on body  Psychiatric:  She has a normal mood and affect. Her behavior is normal.  Nursing note and vitals reviewed.    ED Treatments / Results  Labs (all labs ordered are listed, but only abnormal results are displayed) Labs Reviewed - No data to display  EKG  EKG Interpretation None       Radiology No results found.  Procedures Procedures (including critical care time)  Medications Ordered in ED Medications  sodium chloride 0.9 % bolus 1,000 mL (1,000 mLs Intravenous New Bag/Given 03/04/17 1325)  methylPREDNISolone sodium succinate (SOLU-MEDROL) 125 mg/2 mL injection 125 mg (125 mg Intravenous Given 03/04/17 1325)  diphenhydrAMINE (BENADRYL) injection 25 mg (25 mg Intravenous Given 03/04/17 1325)  famotidine (PEPCID) IVPB 20 mg premix (20 mg Intravenous New Bag/Given 03/04/17 1325)      Initial Impression / Assessment and Plan / ED Course  I have reviewed the triage vital signs and the nursing notes.  Pertinent labs & imaging results that were available during my care of the patient were reviewed by me and considered in my medical decision making (see chart for details).     Patient presents with an obvious allergic phenomenon. She has been on lisinopril for 1 year. Although she has some lower lip swelling, the generalized rash and pruritus is atypical for an ACE-I allergy.  Patient feels better after IV steroids, IV Benadryl, IV Pepcid. Will discharge home with oral prednisone. Discussed possibility of discontinuation of lisinopril  Final Clinical Impressions(s) / ED Diagnoses   Final diagnoses:  Allergic reaction, initial encounter    New Prescriptions New Prescriptions   PREDNISOLONE (PRELONE) 15 MG/5ML SYRUP    Take 5 mLs (15 mg total) by mouth daily.   PREDNISONE (DELTASONE) 10 MG TABLET    Take 2 tablets (20 mg total) by mouth daily.     Donnetta Hutching, MD 03/04/17 [phone]

## 2017-03-04 NOTE — ED Triage Notes (Signed)
Patient c/o allergic reaction but denies taking any new medications, eating any new foods, No new detergents or soaps. Per patient rash and itching to scalp that started Friday. Patient states lower lip started swelling this morning. Patient does take lisinopril. Patient denies any difficulty swallowing or breathing. Per patient she also has a generalized abd pain/lower abd pressure and a yeast infection. Per patient no nausea, vomiting, or fever but diarrhea.

## 2017-03-04 NOTE — Discharge Instructions (Signed)
This may or may not have been caused by lisinopril. He will need to discuss this with your primary care doctor. Prescription for prednisone. Can also take Benadryl.

## 2019-07-01 ENCOUNTER — Other Ambulatory Visit: Payer: Self-pay

## 2019-07-01 DIAGNOSIS — Z20822 Contact with and (suspected) exposure to covid-19: Secondary | ICD-10-CM

## 2019-07-02 ENCOUNTER — Telehealth: Payer: Self-pay

## 2019-07-02 NOTE — Telephone Encounter (Signed)
Patient called for her COVID-19 test result.  She was told that her test was active but result was still pending.  She will call later.

## 2019-07-03 ENCOUNTER — Telehealth: Payer: Self-pay | Admitting: *Deleted

## 2019-07-03 LAB — NOVEL CORONAVIRUS, NAA: SARS-CoV-2, NAA: NOT DETECTED

## 2019-07-03 NOTE — Telephone Encounter (Signed)
Pt called for result of COVID test obtained 07/01/2019; explained result is not back, and the time to completion is based on the number of tests that must be completed; also explained the results are available first in MyChart; the pt would like a phone call when her results are ready;she will also attempt to use MyChart to review her result.

## 2019-07-03 NOTE — Telephone Encounter (Signed)
Pt calling for covid results; negative. Verbalizes understanding. 

## 2020-03-19 ENCOUNTER — Other Ambulatory Visit: Payer: Self-pay | Admitting: Nurse Practitioner

## 2020-03-19 DIAGNOSIS — F17210 Nicotine dependence, cigarettes, uncomplicated: Secondary | ICD-10-CM

## 2020-03-19 DIAGNOSIS — G6281 Critical illness polyneuropathy: Secondary | ICD-10-CM

## 2020-03-24 ENCOUNTER — Ambulatory Visit
Admission: RE | Admit: 2020-03-24 | Discharge: 2020-03-24 | Disposition: A | Discharge status: Home / Self Care | Payer: Medicare Other | Source: Ambulatory Visit | Attending: Nurse Practitioner | Admitting: Nurse Practitioner

## 2020-03-24 DIAGNOSIS — F17210 Nicotine dependence, cigarettes, uncomplicated: Secondary | ICD-10-CM

## 2020-03-24 DIAGNOSIS — G6281 Critical illness polyneuropathy: Secondary | ICD-10-CM

## 2020-04-06 ENCOUNTER — Other Ambulatory Visit (HOSPITAL_COMMUNITY): Payer: Self-pay | Admitting: Family Medicine

## 2020-04-06 DIAGNOSIS — Z1231 Encounter for screening mammogram for malignant neoplasm of breast: Secondary | ICD-10-CM

## 2020-04-15 ENCOUNTER — Ambulatory Visit (HOSPITAL_COMMUNITY): Payer: Medicare Other

## 2020-04-19 ENCOUNTER — Ambulatory Visit (HOSPITAL_COMMUNITY)
Admission: RE | Admit: 2020-04-19 | Discharge: 2020-04-19 | Disposition: A | Discharge status: Home / Self Care | Payer: Medicare Other | Source: Ambulatory Visit | Attending: Family Medicine | Admitting: Family Medicine

## 2020-04-19 ENCOUNTER — Other Ambulatory Visit: Payer: Self-pay

## 2020-04-19 DIAGNOSIS — Z1231 Encounter for screening mammogram for malignant neoplasm of breast: Secondary | ICD-10-CM | POA: Insufficient documentation

## 2020-04-19 IMAGING — MG DIGITAL SCREENING BILAT W/ TOMO W/ CAD
8 series · 8 of 24 positions shown · non-contrast
Comparison: Previous exam(s).

ACR Breast Density Category a: The breast tissue is almost entirely
fatty.

CLINICAL DATA: Screening.

EXAM:
DIGITAL SCREENING BILATERAL MAMMOGRAM WITH TOMO AND CAD

[L CC synth-2D]
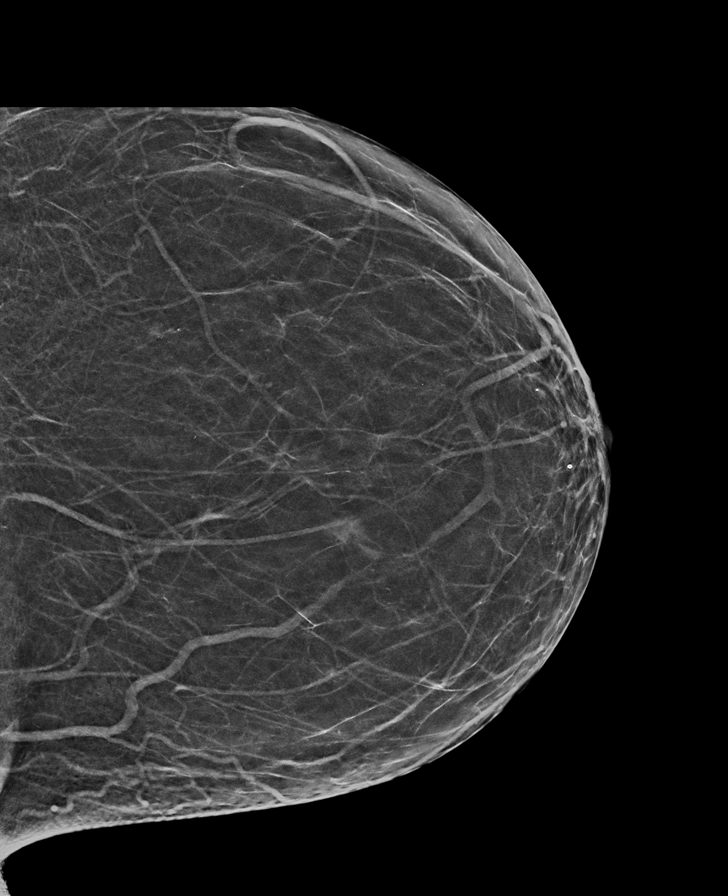

[L MLO synth-2D]
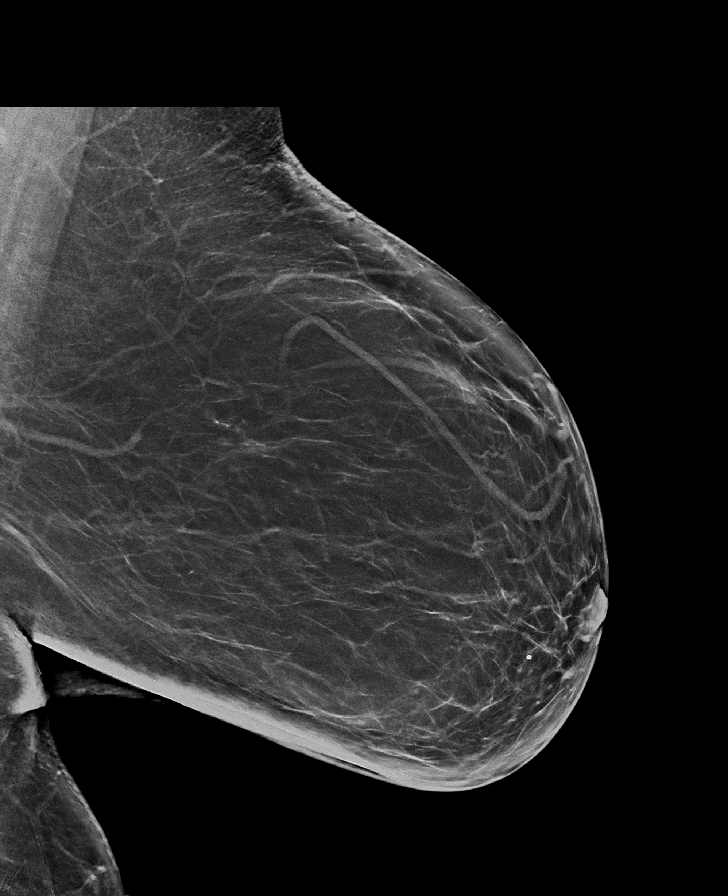

[R MLO synth-2D]
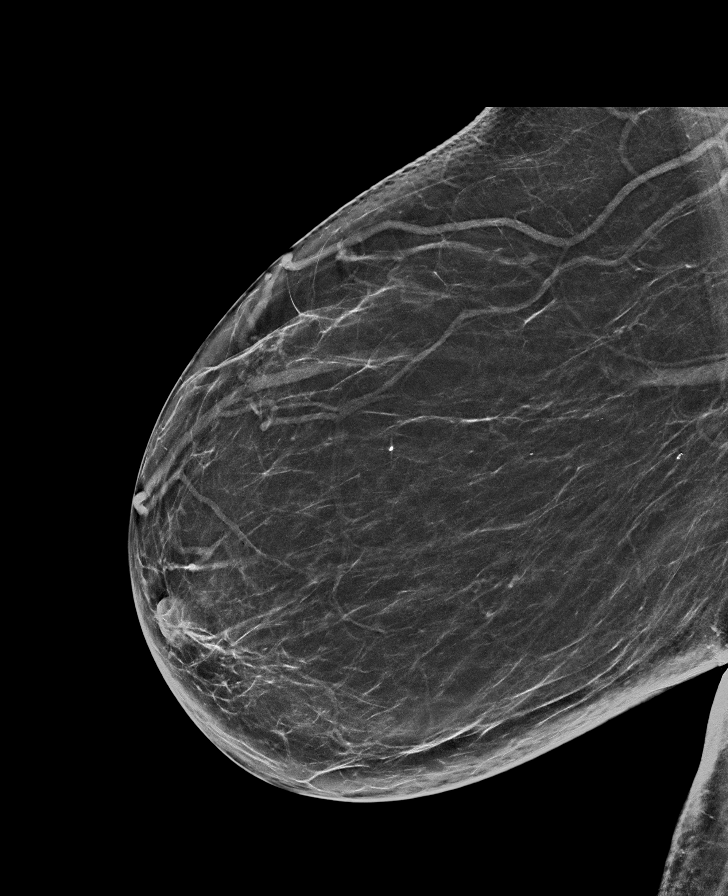

[R CC synth-2D]
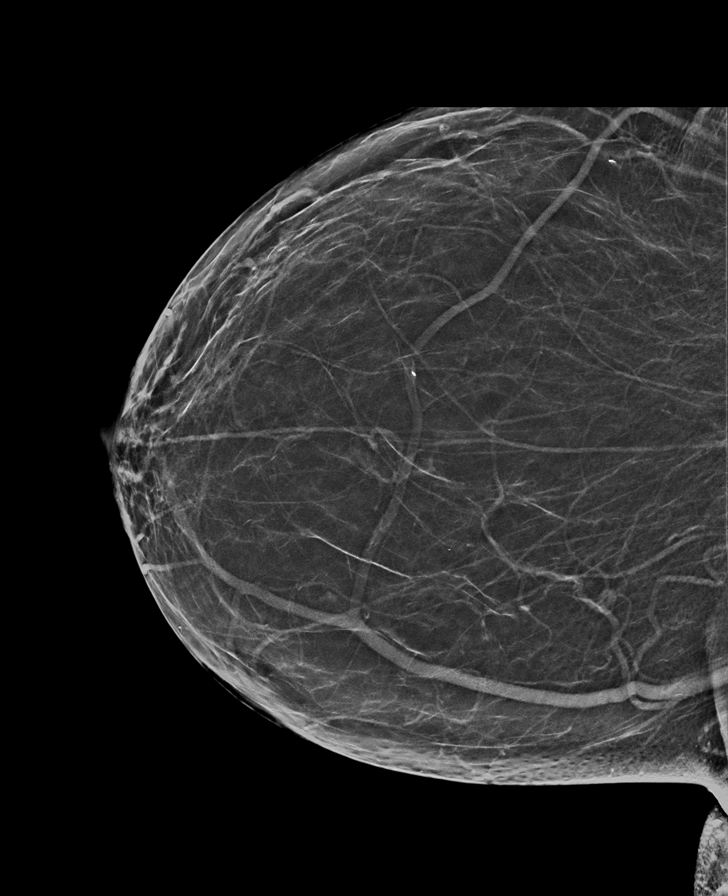

[L MLO tomo · tomo slice 41/82.0]
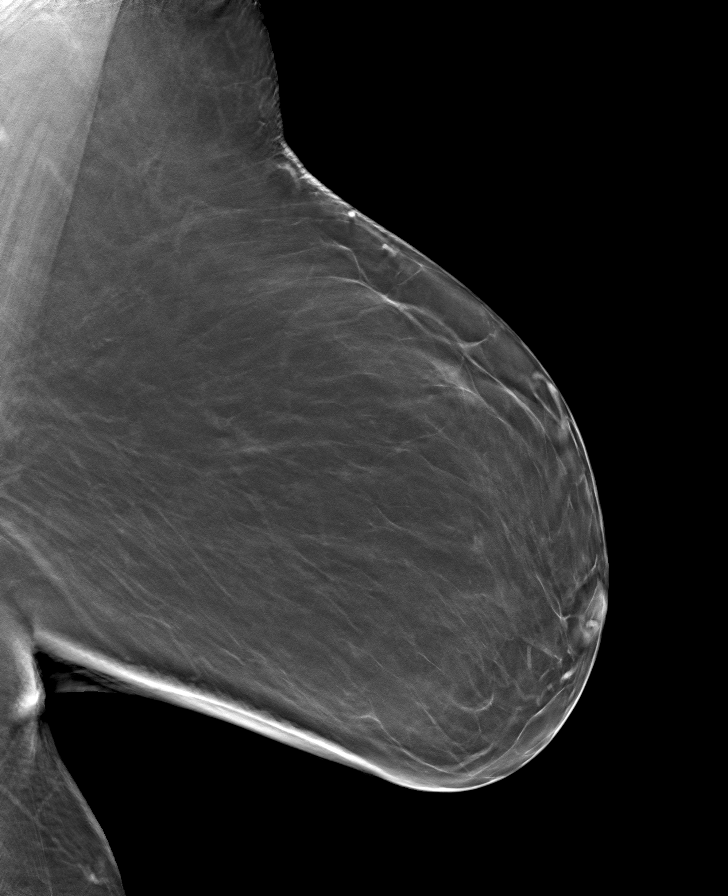

[L CC tomo · tomo slice 31/60.0]
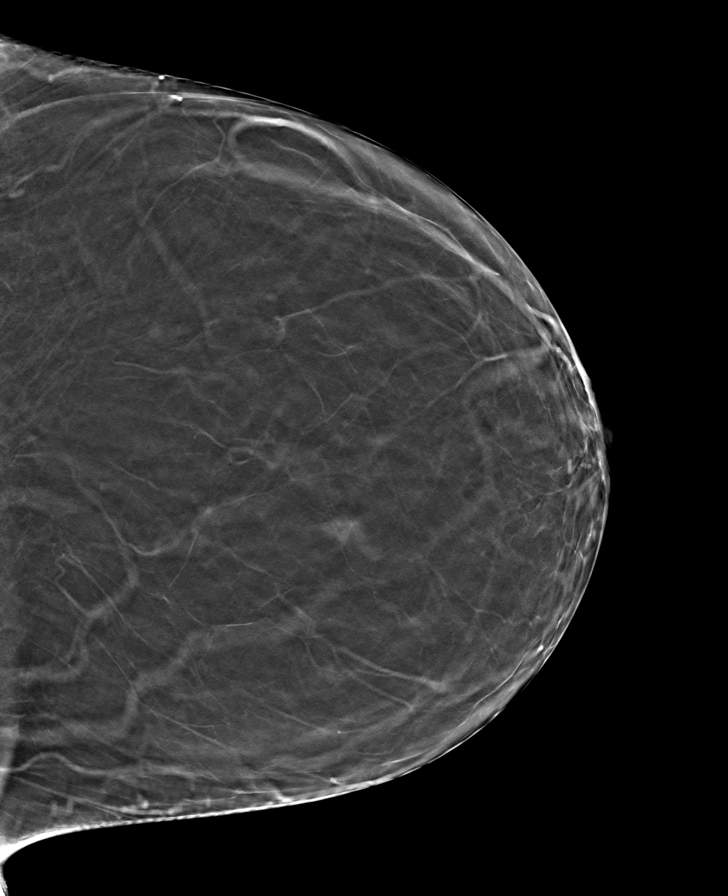

[R CC tomo · tomo slice 33/65.0]
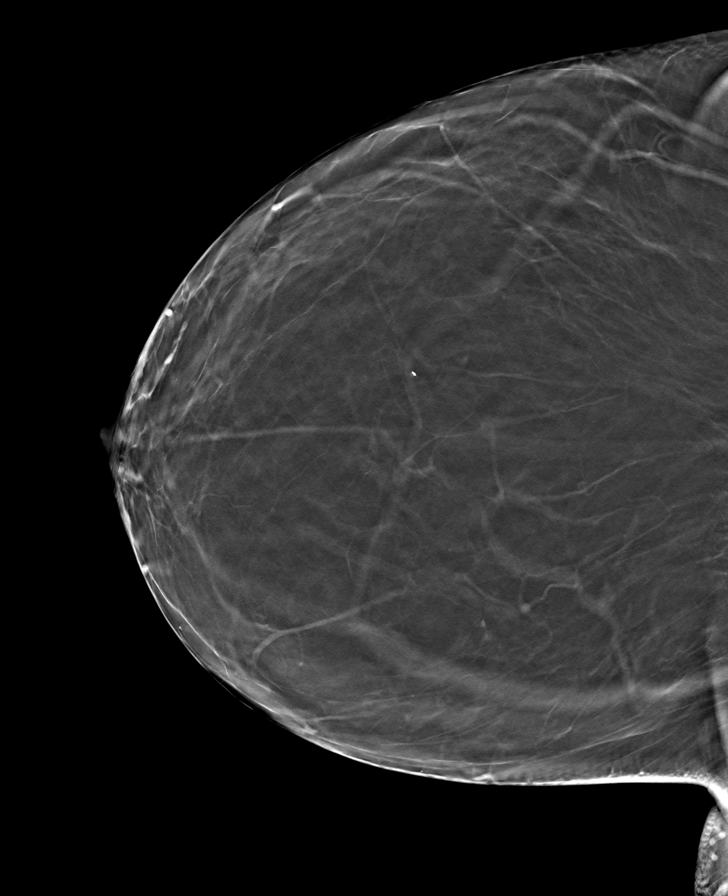

[R MLO tomo · tomo slice 37/74.0]
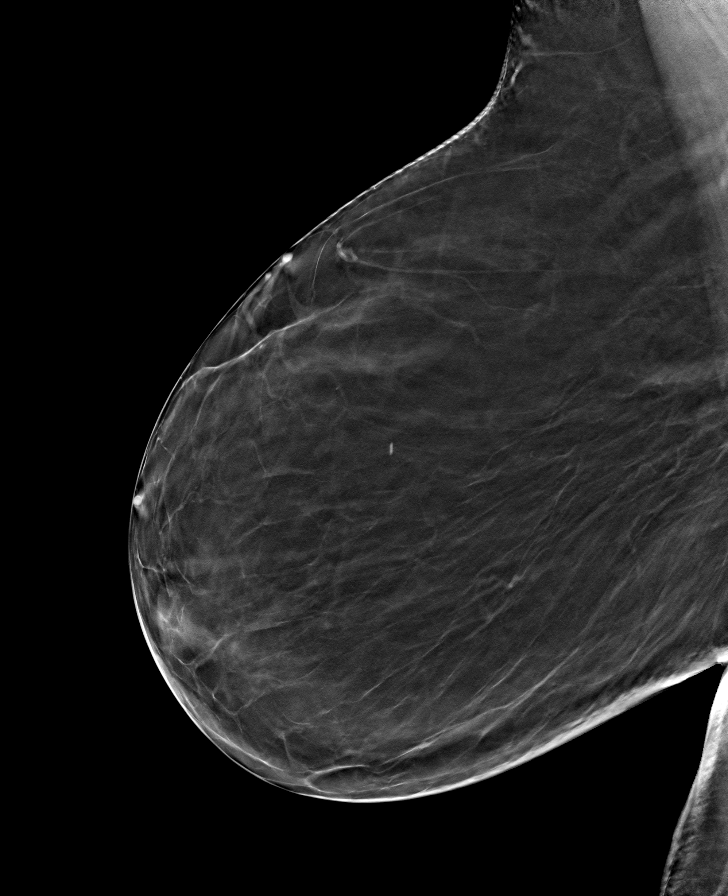

[8 of 24 positions shown; findings below may reference images not displayed]

FINDINGS: In the left breast, calcifications warrant further evaluation. In
the right breast, no findings suspicious for malignancy. Images were
processed with CAD.
IMPRESSION: Further evaluation is suggested for calcifications in the left
breast.

RECOMMENDATION:
Diagnostic mammogram of the left breast. (Code:[ZY])

The patient will be contacted regarding the findings, and additional
imaging will be scheduled.

BI-RADS CATEGORY  0: Incomplete. Need additional imaging evaluation
and/or prior mammograms for comparison.

## 2020-04-20 ENCOUNTER — Other Ambulatory Visit (HOSPITAL_COMMUNITY): Payer: Self-pay | Admitting: Family Medicine

## 2020-04-23 ENCOUNTER — Other Ambulatory Visit (HOSPITAL_COMMUNITY): Payer: Self-pay | Admitting: Nurse Practitioner

## 2020-04-23 ENCOUNTER — Ambulatory Visit (HOSPITAL_COMMUNITY): Admission: RE | Admit: 2020-04-23 | Payer: Medicare Other | Source: Ambulatory Visit

## 2020-04-23 ENCOUNTER — Ambulatory Visit (HOSPITAL_COMMUNITY): Payer: Medicare Other

## 2020-04-23 DIAGNOSIS — R920 Mammographic microcalcification found on diagnostic imaging of breast: Secondary | ICD-10-CM

## 2020-05-07 ENCOUNTER — Other Ambulatory Visit (HOSPITAL_COMMUNITY): Payer: Self-pay | Admitting: Nurse Practitioner

## 2020-05-07 ENCOUNTER — Ambulatory Visit (HOSPITAL_COMMUNITY)
Admission: RE | Admit: 2020-05-07 | Discharge: 2020-05-07 | Disposition: A | Discharge status: Home / Self Care | Payer: Medicare Other | Source: Ambulatory Visit | Attending: Nurse Practitioner | Admitting: Nurse Practitioner

## 2020-05-07 ENCOUNTER — Other Ambulatory Visit: Payer: Self-pay

## 2020-05-07 DIAGNOSIS — R920 Mammographic microcalcification found on diagnostic imaging of breast: Secondary | ICD-10-CM | POA: Diagnosis present

## 2020-05-07 IMAGING — MG MM DIGITAL DIAGNOSTIC UNILAT*L* W/ TOMO W/ CAD
4 series · 4 of 8 positions shown · non-contrast
Comparison: Previous exams.

CLINICAL DATA: Screening recall for left breast calcifications.

EXAM:
DIGITAL DIAGNOSTIC UNILATERAL LEFT MAMMOGRAM WITH TOMO AND CAD

[L CC]
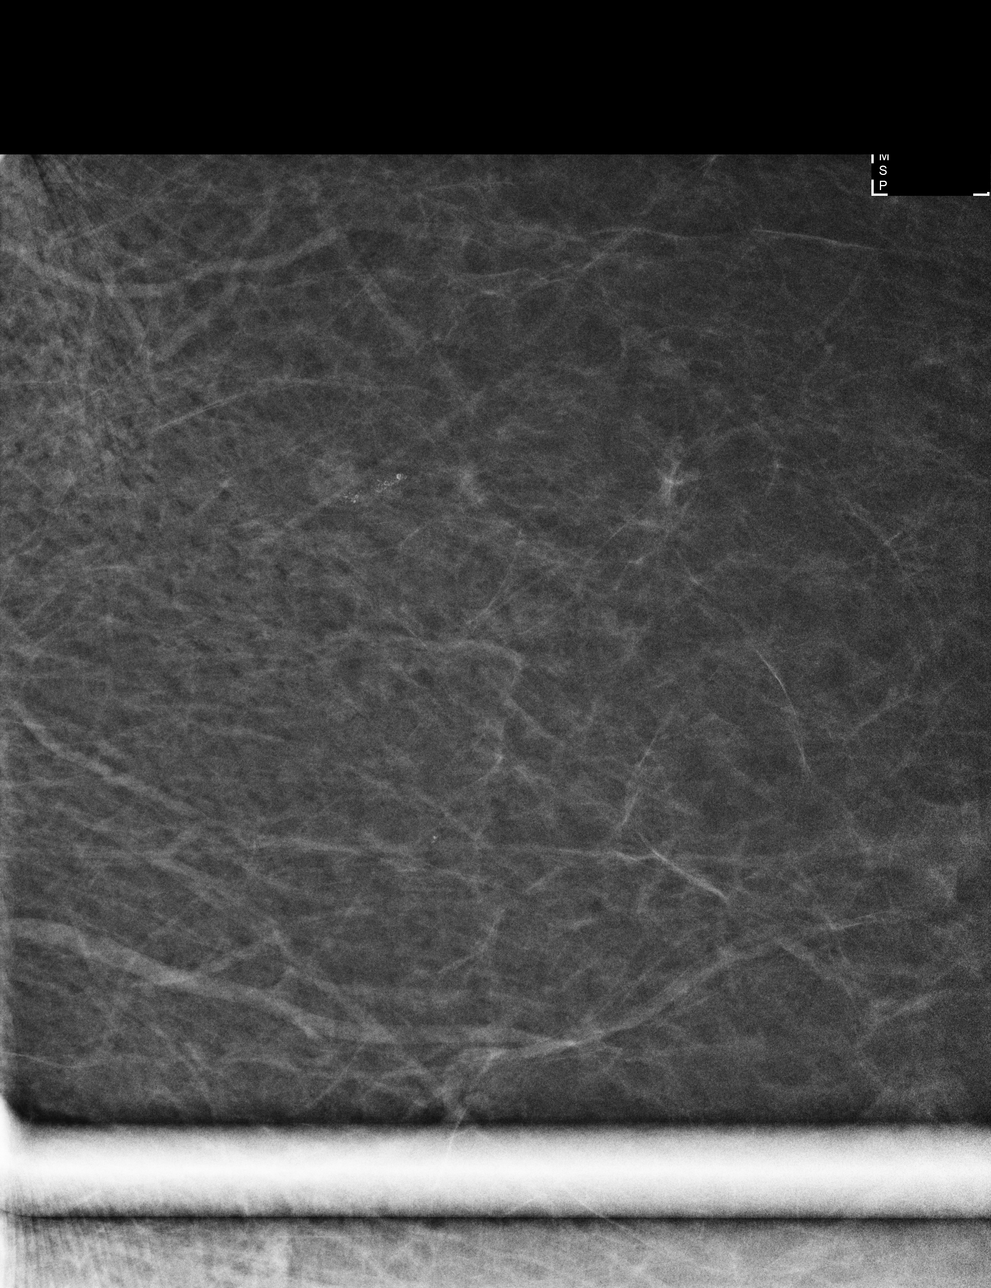

[L ML]
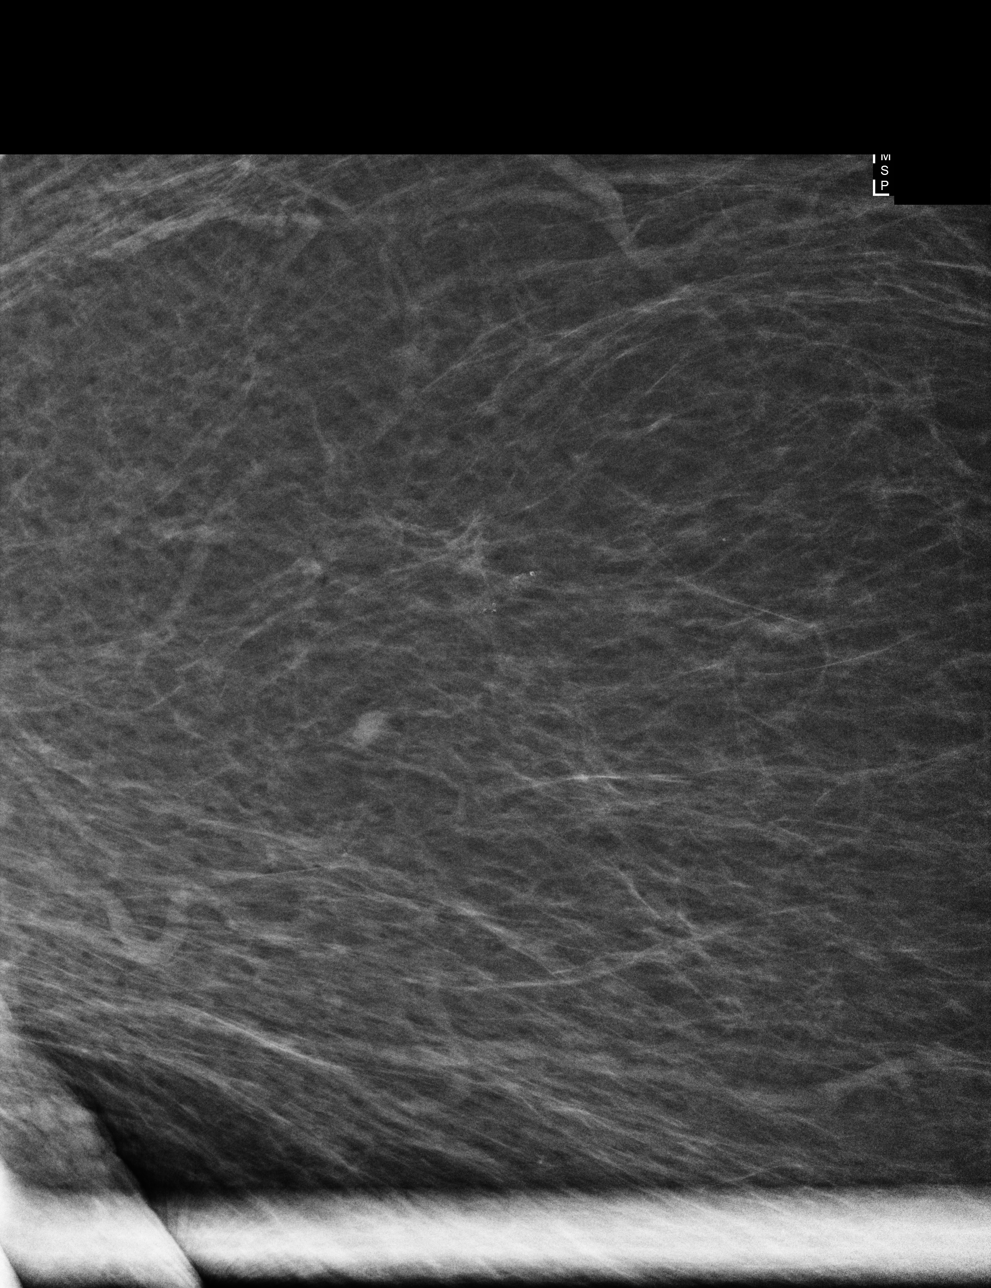

[L ML synth-2D]
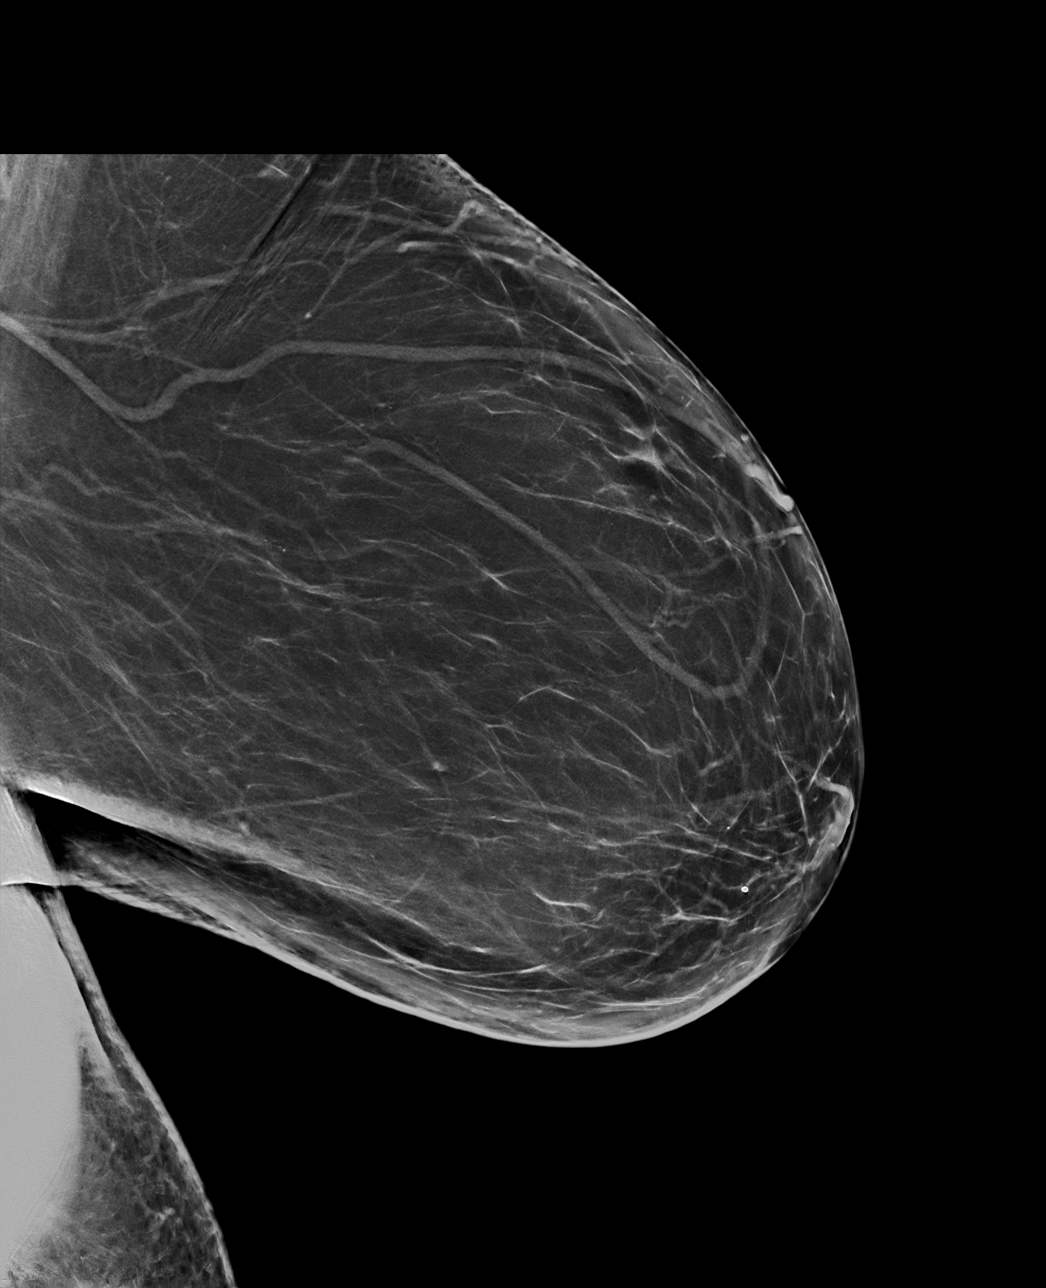

[L ML tomo · tomo slice 41/80.0]
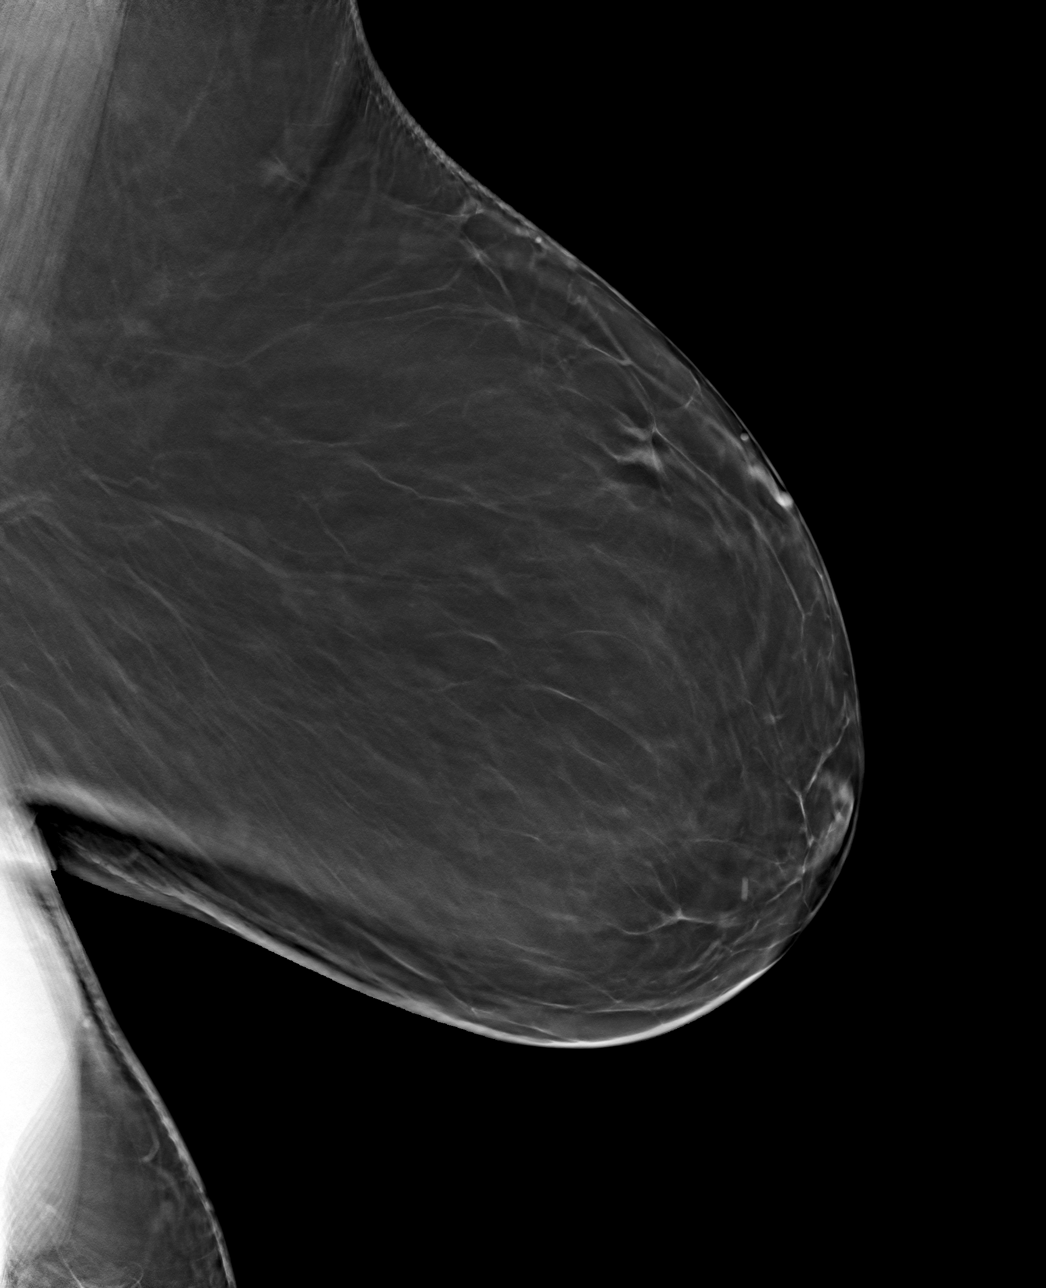

[4 of 8 positions shown; findings below may reference images not displayed]

ACR Breast Density Category b: There are scattered areas of
fibroglandular density.
FINDINGS: Spot compression magnification views were performed of the left
breast. There is a 0.7 cm group of amorphous calcifications within
the slightly lower outer left breast.

Mammographic images were processed with CAD.
IMPRESSION: New indeterminate 0.7 cm group calcifications in the slightly lower
outer left breast.

RECOMMENDATION:
Recommend stereotactic guided biopsy of the left breast
calcifications.

I have discussed the findings and recommendations with the patient.
If applicable, a reminder letter will be sent to the patient
regarding the next appointment.

BI-RADS CATEGORY  4: Suspicious.

## 2020-05-11 ENCOUNTER — Other Ambulatory Visit: Payer: Self-pay | Admitting: Nurse Practitioner

## 2020-05-11 DIAGNOSIS — R921 Mammographic calcification found on diagnostic imaging of breast: Secondary | ICD-10-CM

## 2020-05-18 ENCOUNTER — Other Ambulatory Visit (HOSPITAL_COMMUNITY): Payer: Medicare Other

## 2020-05-18 ENCOUNTER — Encounter (HOSPITAL_COMMUNITY): Payer: Medicare Other

## 2020-05-19 ENCOUNTER — Ambulatory Visit
Admission: RE | Admit: 2020-05-19 | Discharge: 2020-05-19 | Disposition: A | Discharge status: Home / Self Care | Payer: Medicare Other | Source: Ambulatory Visit | Attending: Nurse Practitioner | Admitting: Nurse Practitioner

## 2020-05-19 ENCOUNTER — Other Ambulatory Visit: Payer: Self-pay

## 2020-05-19 DIAGNOSIS — R921 Mammographic calcification found on diagnostic imaging of breast: Secondary | ICD-10-CM

## 2020-05-19 IMAGING — MG MM BREAST LOCALIZATION CLIP
4 series · 4 of 12 positions shown · non-contrast
Comparison: Previous exam(s).

CLINICAL DATA: 7 mm group of indeterminate calcifications in the
outer left breast at recent mammography.

EXAM:
DIAGNOSTIC LEFT MAMMOGRAM POST STEREOTACTIC BIOPSY

[L CC synth-2D]
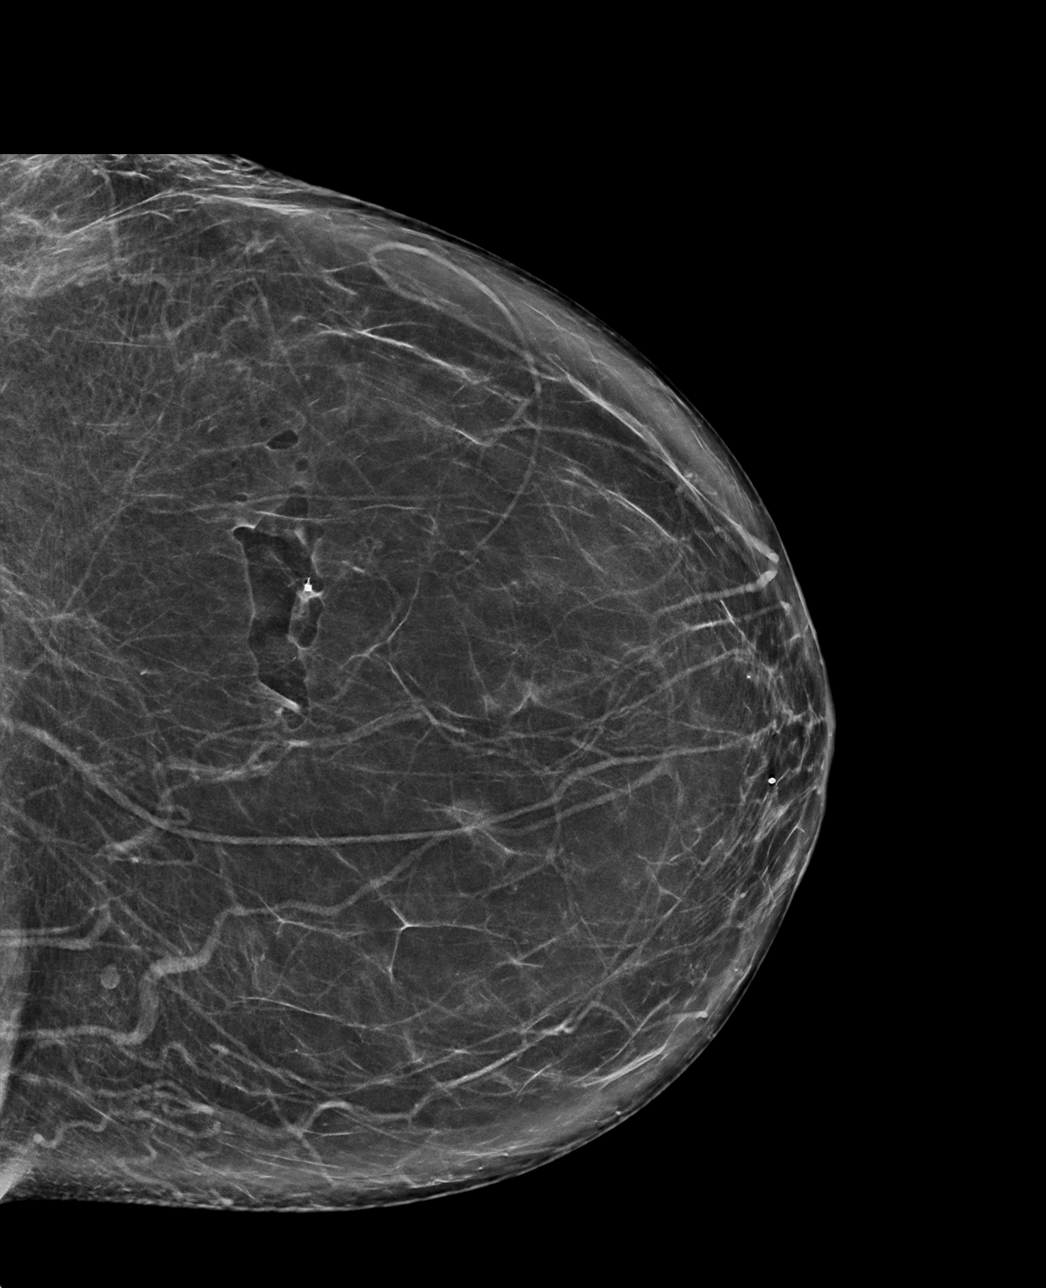

[L LM synth-2D]
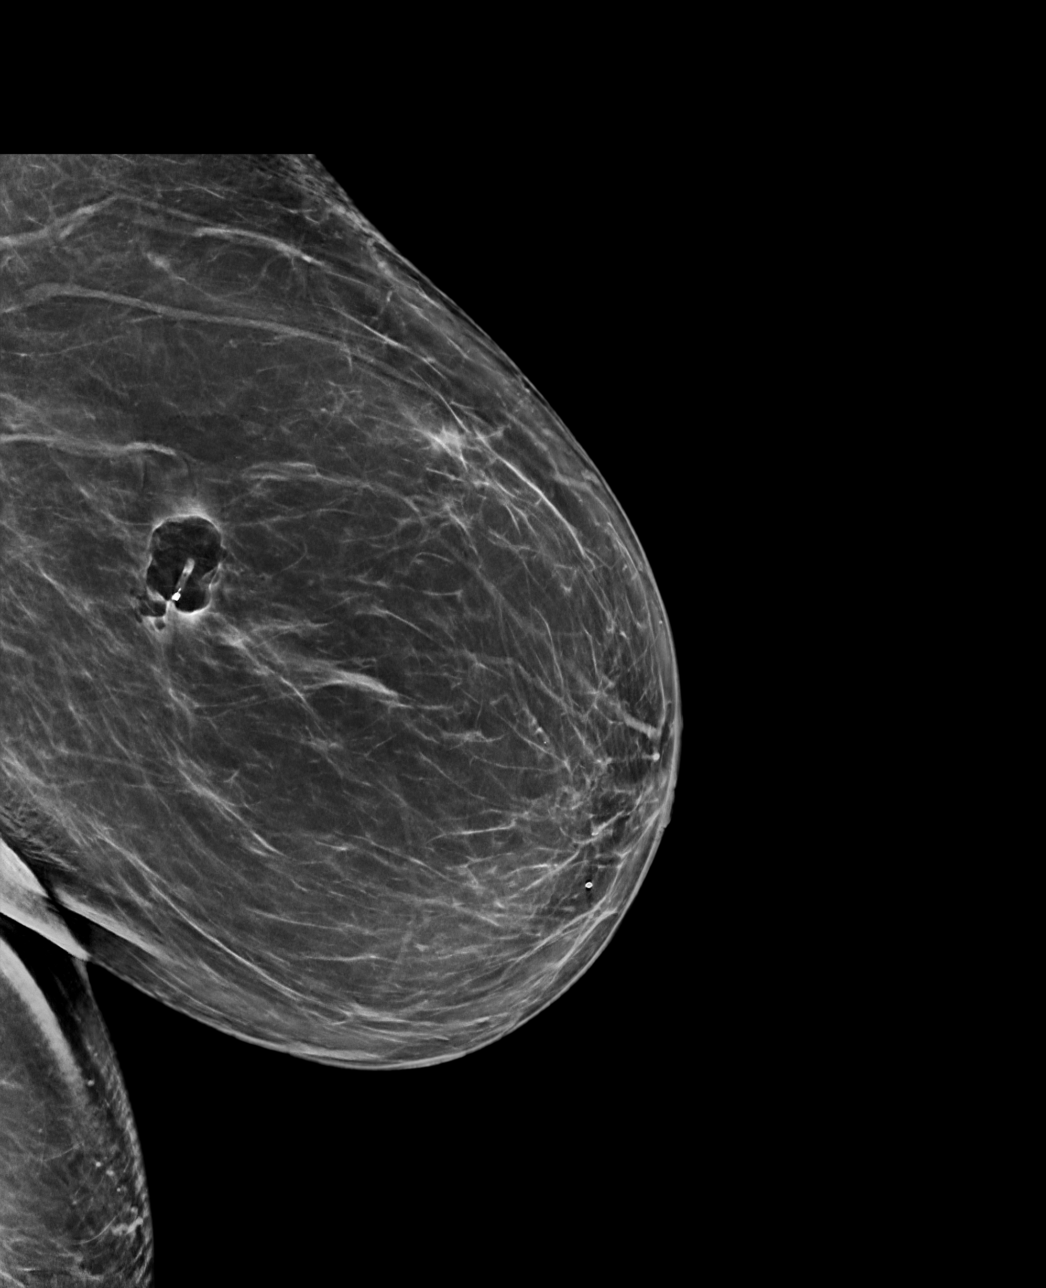

[L LM tomo · tomo slice 47/94.0]
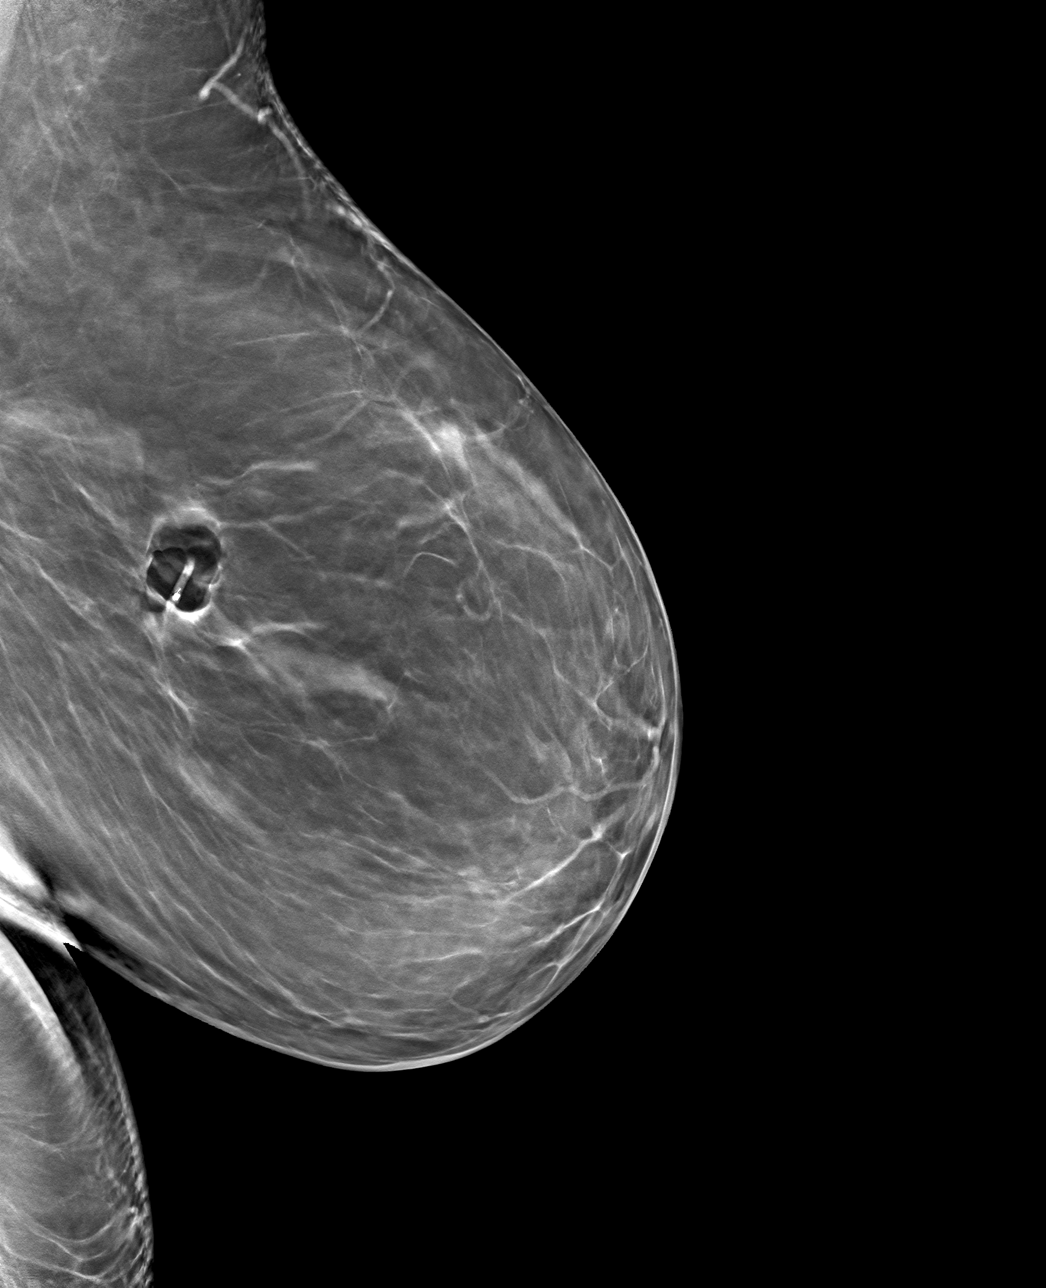

[L CC tomo · tomo slice 41/80.0]
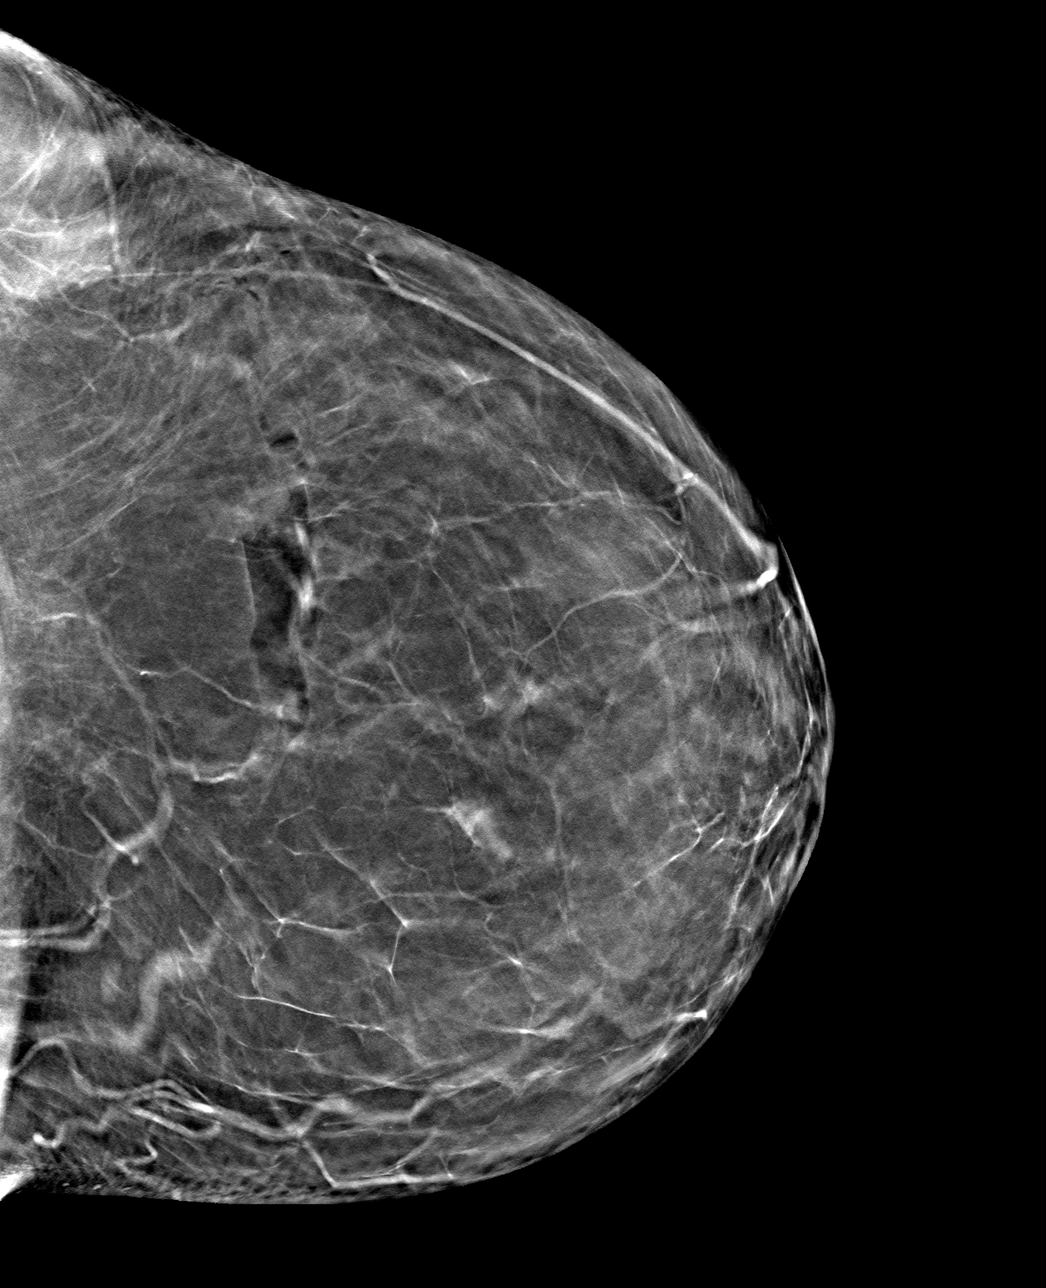

[4 of 12 positions shown; findings below may reference images not displayed]

FINDINGS: Mammographic images were obtained following stereotactic guided
biopsy of the recently demonstrated 7 mm group calcifications in the
outer left breast. The biopsy marking clip is in expected position
at the site of biopsy. The targeted calcifications are absent
following biopsy.
IMPRESSION: Appropriate positioning of the coil shaped biopsy marking clip at
the site of biopsy in the outer left breast.

Final Assessment: Post Procedure Mammograms for Marker Placement

## 2020-05-19 IMAGING — MG MM BREAST BX W LOC DEV 1ST LESION IMAGE BX SPEC STEREO GUIDE*L*
8 of 17 series · 8 of 40 positions shown · non-contrast
Comparison: Previous exams.
COMPARISON: Previous exams.

Addendum:
CLINICAL DATA: 7 mm group of indeterminate calcifications in the
outer left breast on at recent mammography.

EXAM:
LEFT BREAST STEREOTACTIC CORE NEEDLE BIOPSY

[L (1 of 8)]
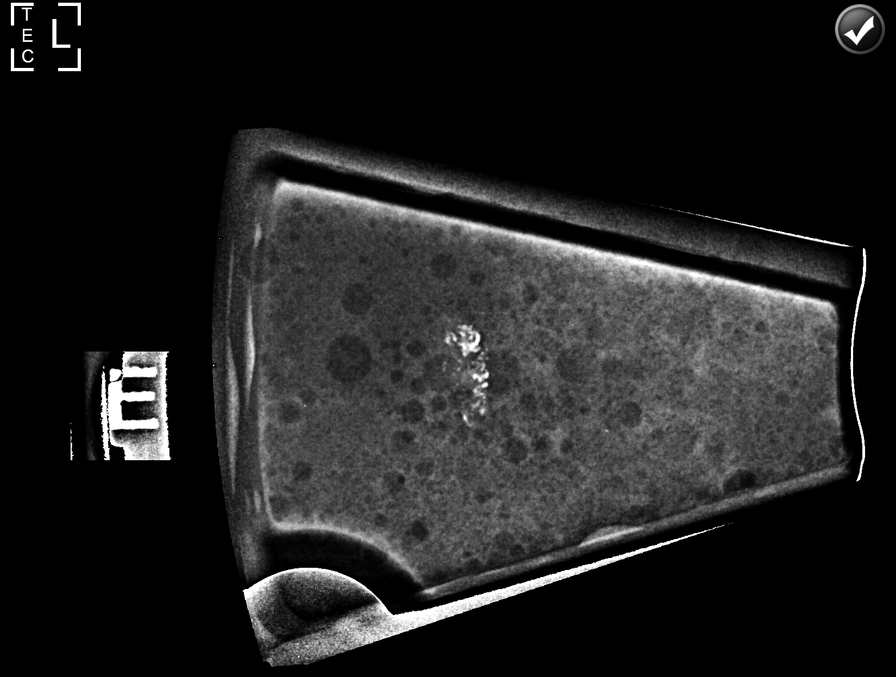

[L (2 of 8)]
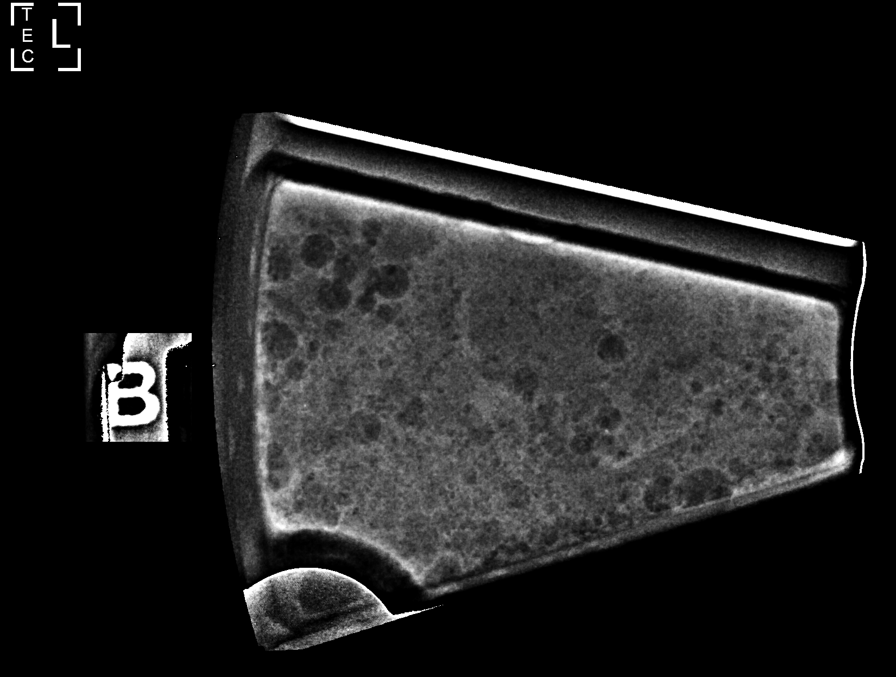

[L (3 of 8)]
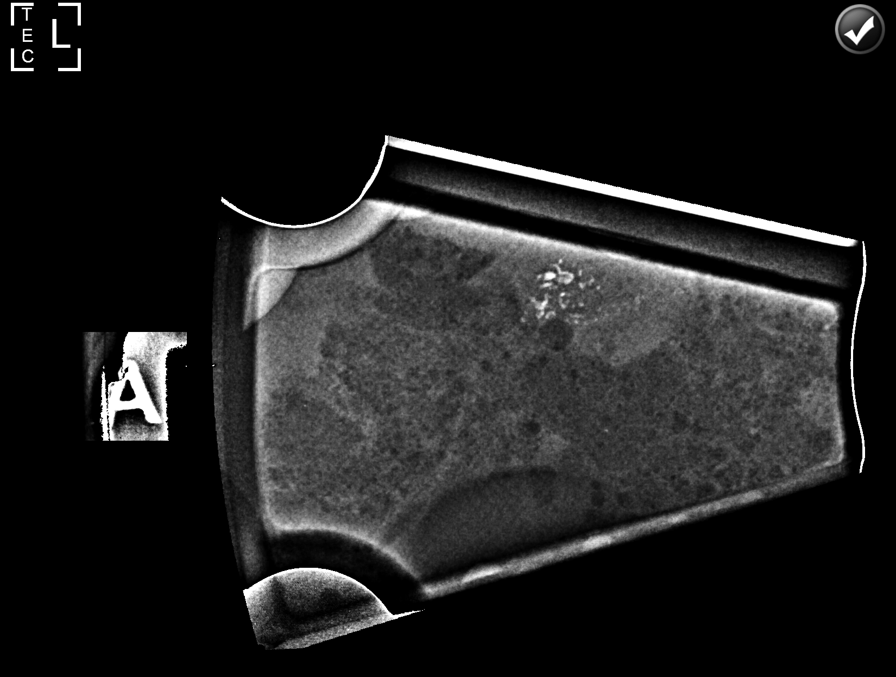

[L (4 of 8)]
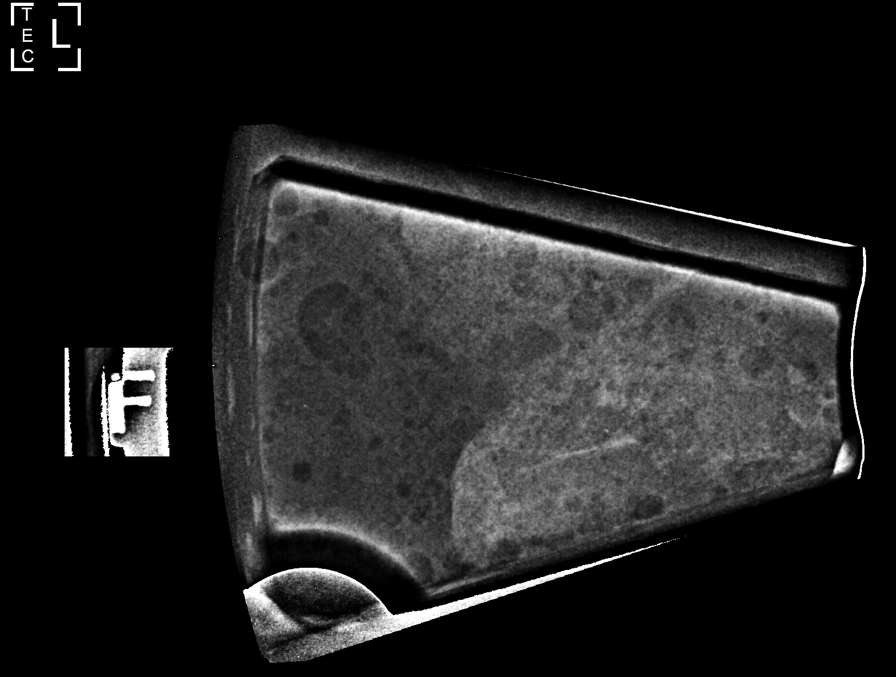

[L (5 of 8)]
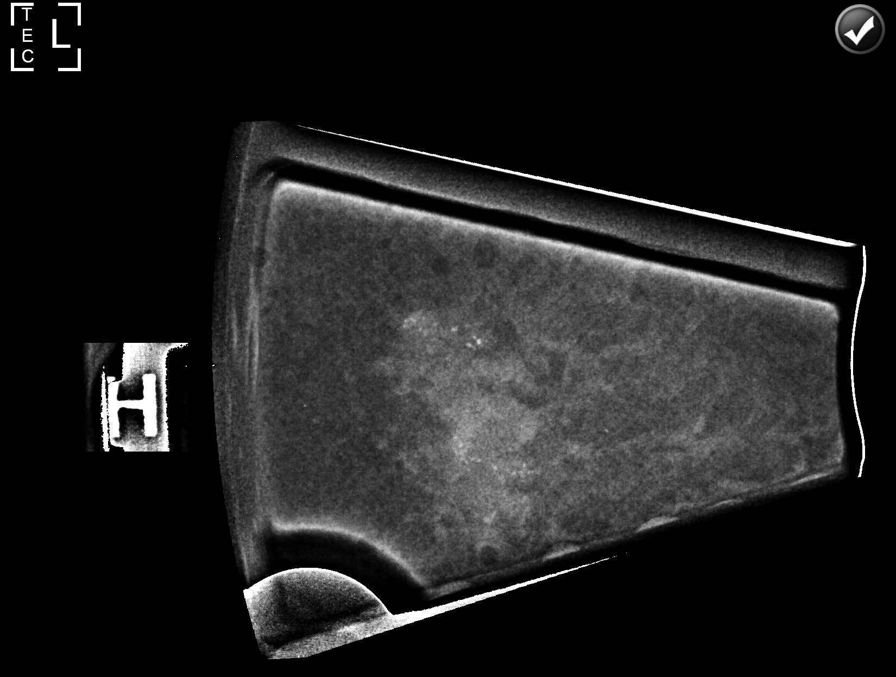

[L (6 of 8)]
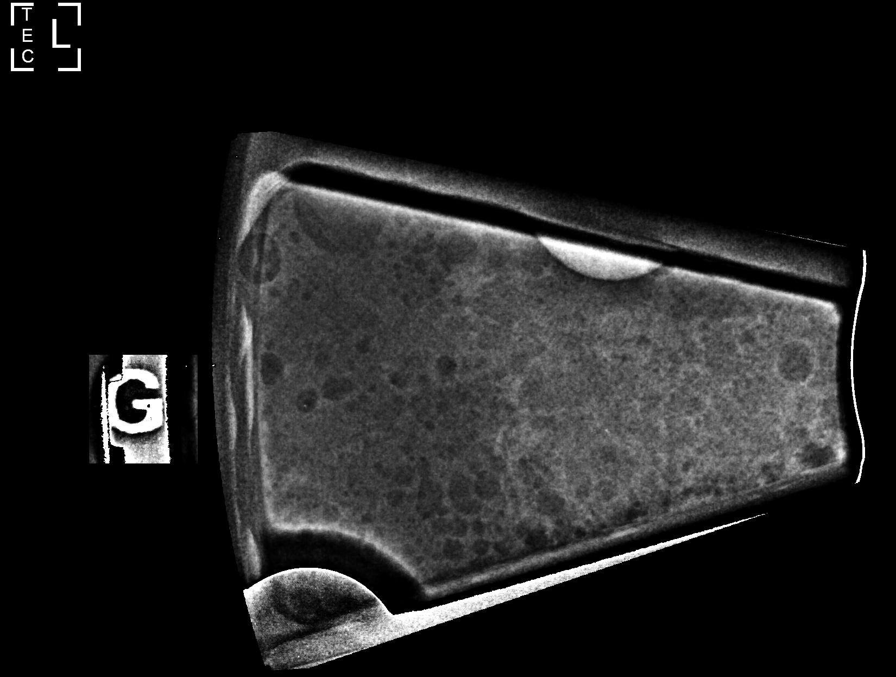

[L (7 of 8)]
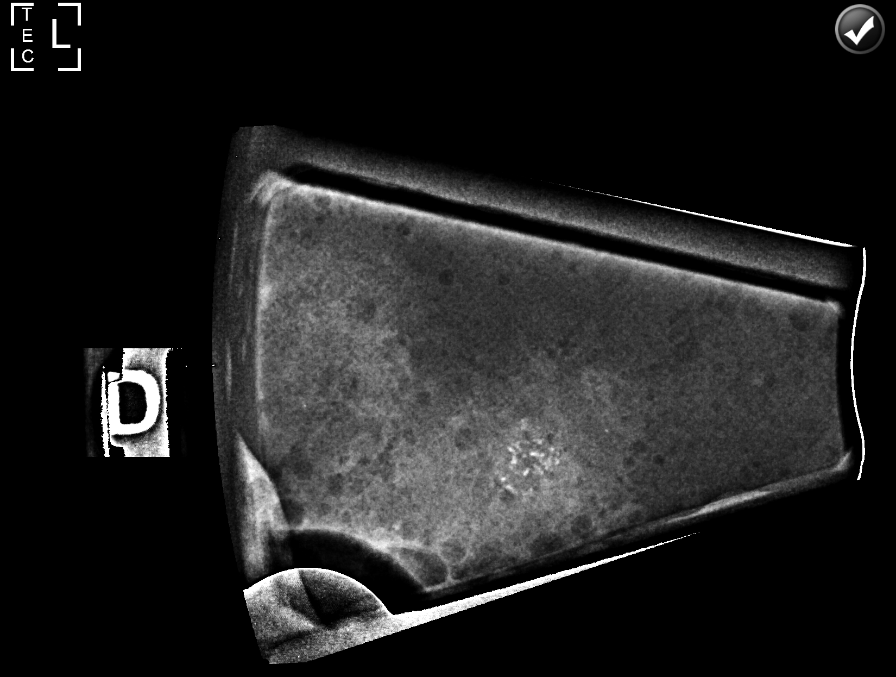

[L (8 of 8)]
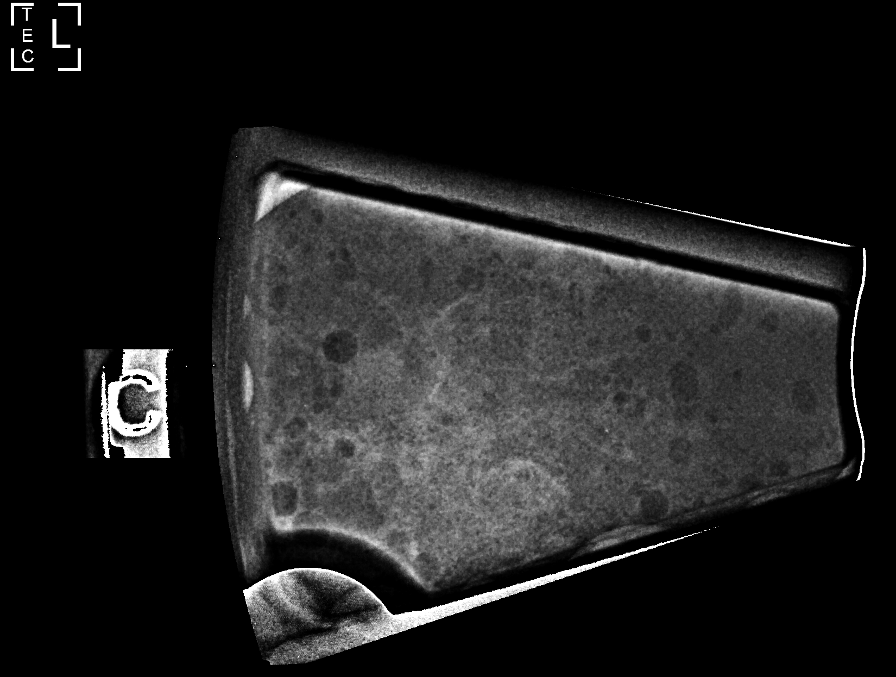

[8 of 40 positions shown; findings below may reference images not displayed]



Using sterile technique and 1% Lidocaine as local anesthetic, under
stereotactic guidance, a 9 gauge vacuum assisted device was used to
perform core needle biopsy of the recently demonstrated 7 mm group
of calcifications in the outer left breast using a lateral approach.
Specimen radiograph was performed showing multiple calcifications in
multiple specimens. Specimens with calcifications are identified for
pathology.

At the conclusion of the procedure, a coil shaped tissue marker clip
was deployed into the biopsy cavity. Follow-up 2-view mammogram was
performed and dictated separately.
IMPRESSION: Stereotactic-guided biopsy of the recently demonstrated 7 mm group
of calcifications in the outer left breast. No apparent
complications.

ADDENDUM:
Pathology revealed FIBROADENOMATOID CHANGE WITH CALCIFICATIONS of
the LEFT breast, outer. This was found to be concordant by Dr.
SCHRECK.

Pathology results were discussed with the patient by telephone. The
patient reported doing well after the biopsy with tenderness at the
site. Post biopsy instructions and care were reviewed and questions
were answered. The patient was encouraged to call The [REDACTED]

The patient was instructed to return for annual screening
mammography in 11 months when due and informed a reminder notice
would be sent regarding this appointment.

Pathology results reported by SCHRECK RN on [DATE].



Using sterile technique and 1% Lidocaine as local anesthetic, under
stereotactic guidance, a 9 gauge vacuum assisted device was used to
perform core needle biopsy of the recently demonstrated 7 mm group
of calcifications in the outer left breast using a lateral approach.
Specimen radiograph was performed showing multiple calcifications in
multiple specimens. Specimens with calcifications are identified for
pathology.

At the conclusion of the procedure, a coil shaped tissue marker clip
was deployed into the biopsy cavity. Follow-up 2-view mammogram was
performed and dictated separately.
IMPRESSION: Stereotactic-guided biopsy of the recently demonstrated 7 mm group
of calcifications in the outer left breast. No apparent
complications.

## 2021-01-27 ENCOUNTER — Other Ambulatory Visit: Payer: Self-pay | Admitting: Pain Medicine

## 2021-01-27 DIAGNOSIS — M5134 Other intervertebral disc degeneration, thoracic region: Secondary | ICD-10-CM

## 2021-01-27 DIAGNOSIS — M5416 Radiculopathy, lumbar region: Secondary | ICD-10-CM

## 2021-01-27 DIAGNOSIS — M5412 Radiculopathy, cervical region: Secondary | ICD-10-CM

## 2021-02-04 ENCOUNTER — Ambulatory Visit
Admission: RE | Admit: 2021-02-04 | Discharge: 2021-02-04 | Disposition: A | Discharge status: Home / Self Care | Payer: Medicare Other | Source: Ambulatory Visit | Attending: Pain Medicine | Admitting: Pain Medicine

## 2021-02-04 DIAGNOSIS — M5412 Radiculopathy, cervical region: Secondary | ICD-10-CM

## 2021-02-04 DIAGNOSIS — M5416 Radiculopathy, lumbar region: Secondary | ICD-10-CM

## 2021-02-04 DIAGNOSIS — M5134 Other intervertebral disc degeneration, thoracic region: Secondary | ICD-10-CM

## 2021-02-04 IMAGING — MR MR THORACIC SPINE W/O CM
4 of 5 series · 21 of 48 positions shown · non-contrast
Comparison: Thoracic spine radiographs [DATE]

CLINICAL DATA: Bilateral arm pain and weakness. Back and left leg
pain. Bilateral foot numbness.

EXAM:
MRI THORACIC SPINE WITHOUT CONTRAST
TECHNIQUE: Multiplanar, multisequence MR imaging of the thoracic spine was
performed. No intravenous contrast was administered.

[Series 4: STIR · sagittal · 3.0mm · 1.37mm/px · 3 of 22 slices shown]
[im 5/22]
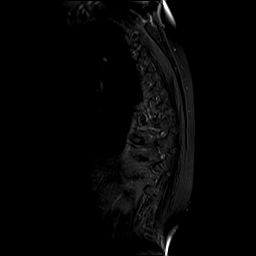
[im 13/22]
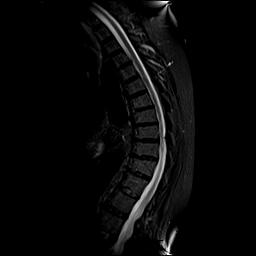
[im 22/22]
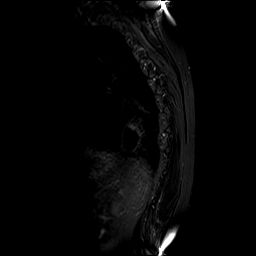

[Series 5: T2 post-contrast · sagittal · 3.0mm · 0.68mm/px · 6 of 22 slices shown]
[im 1/22]
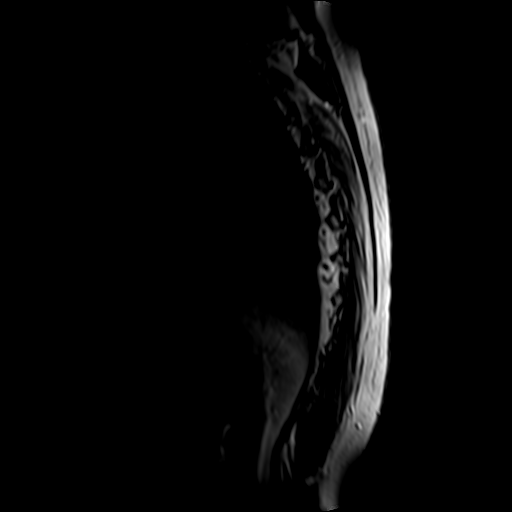
[im 5/22]
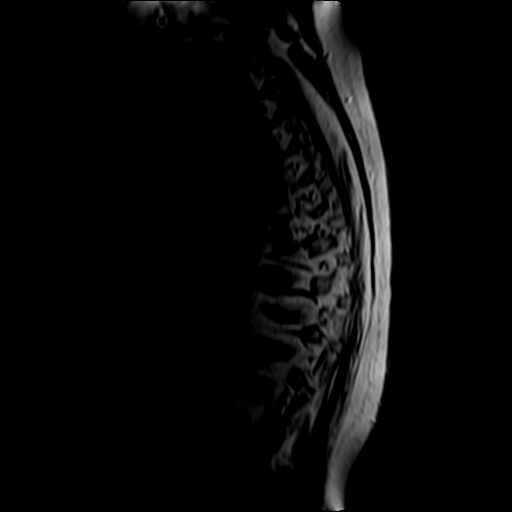
[im 9/22]
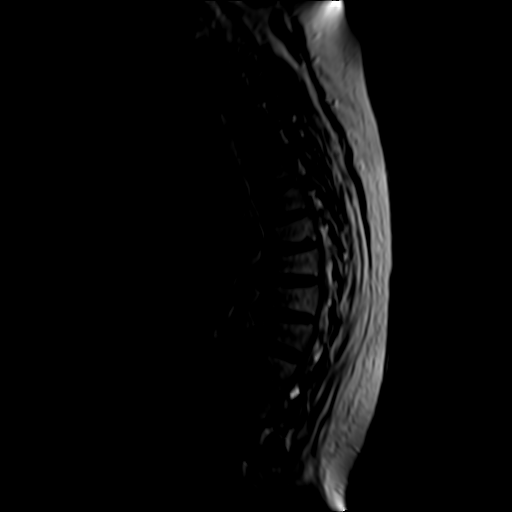
[im 13/22]
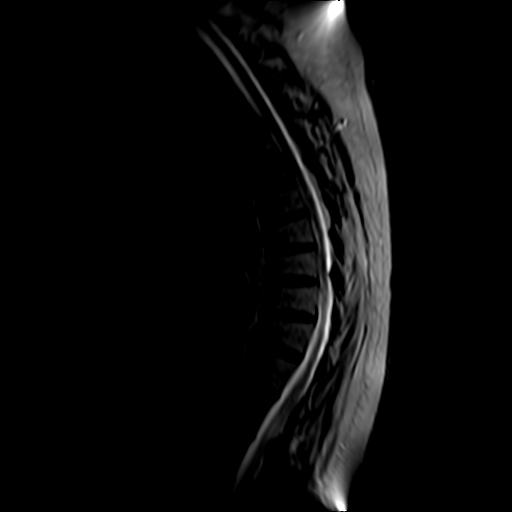
[im 17/22]
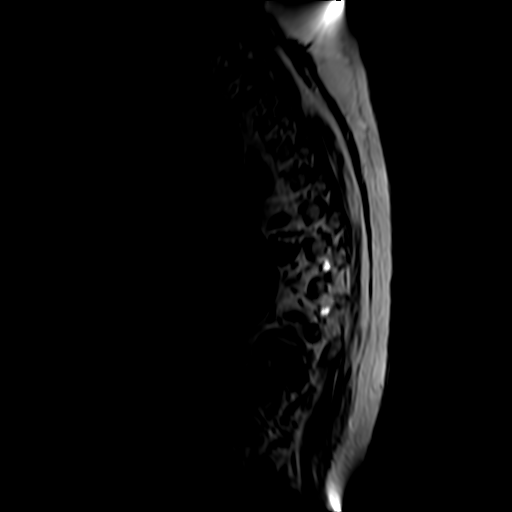
[im 22/22]
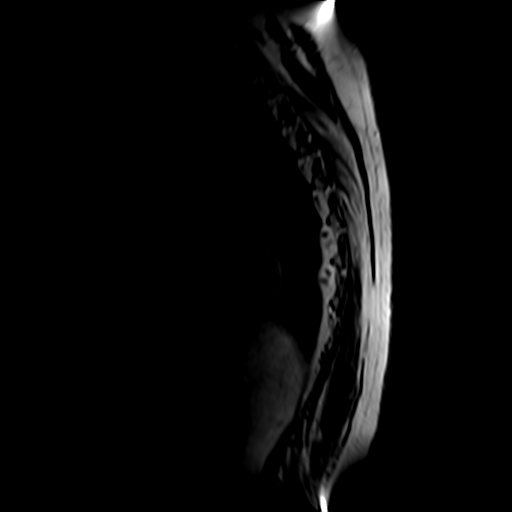

[Series 6: T1 · sagittal · 3.0mm · 0.55mm/px · 3 of 19 slices shown]
[im 4/19]
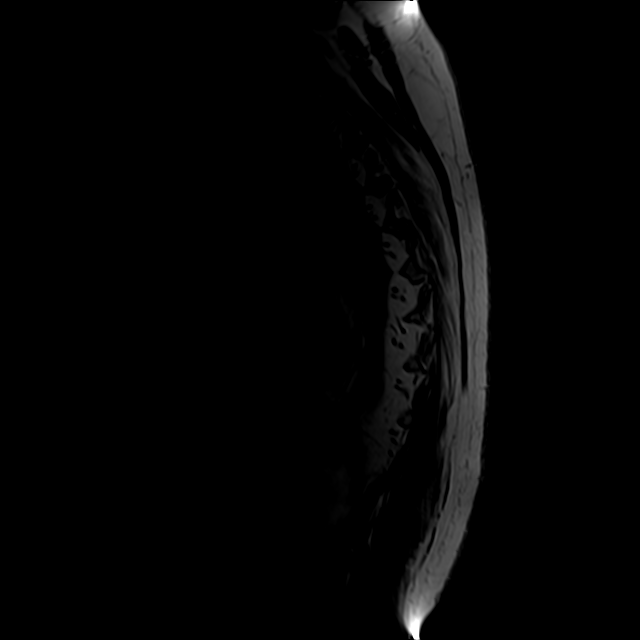
[im 11/19]
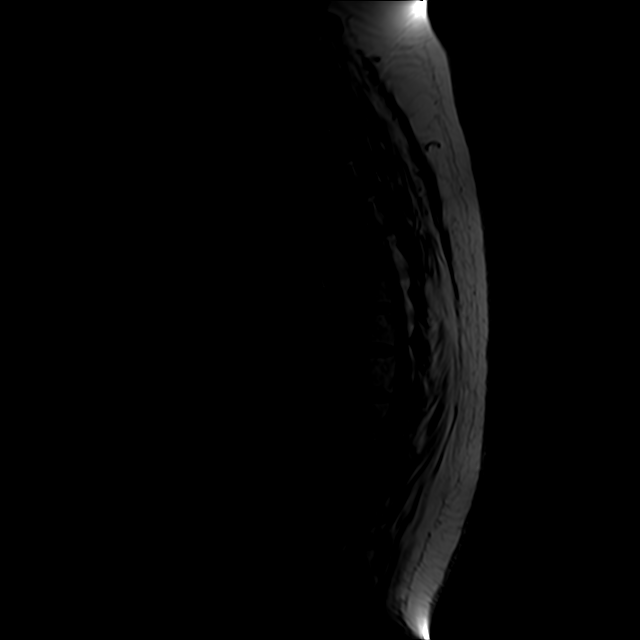
[im 19/19]
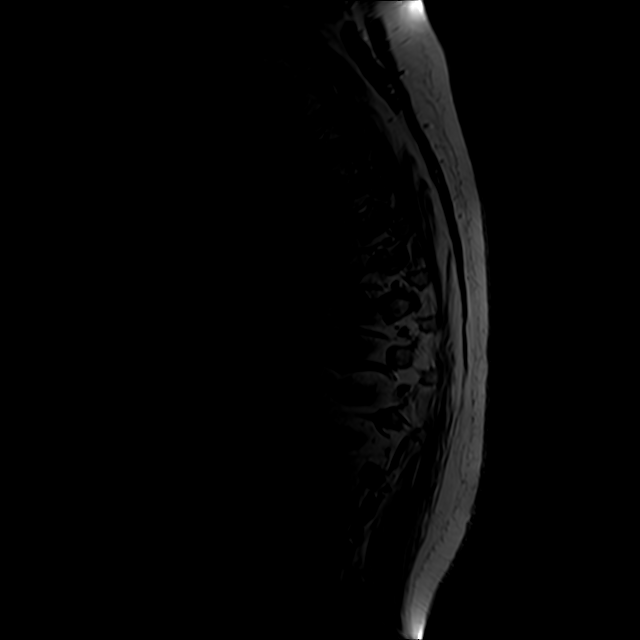

[Series 7: T2 · axial · 4.0mm · 0.39mm/px · z∈[-243,+36]mm · 9 of 49 slices shown]
[im 1/49]
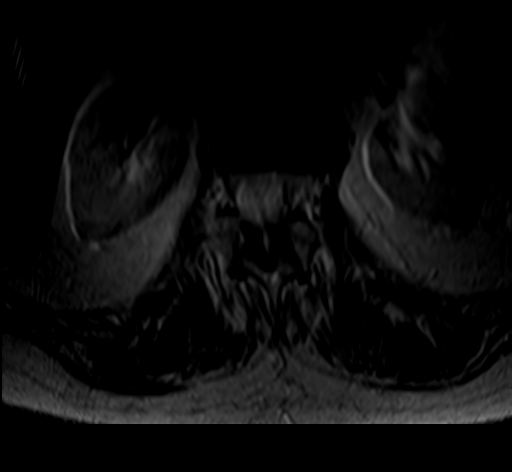
[im 7/49]
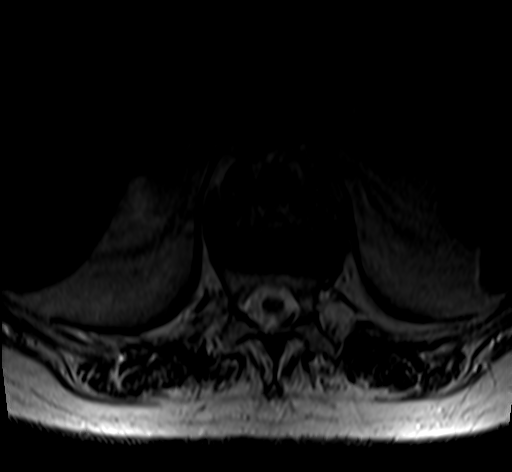
[im 14/49]
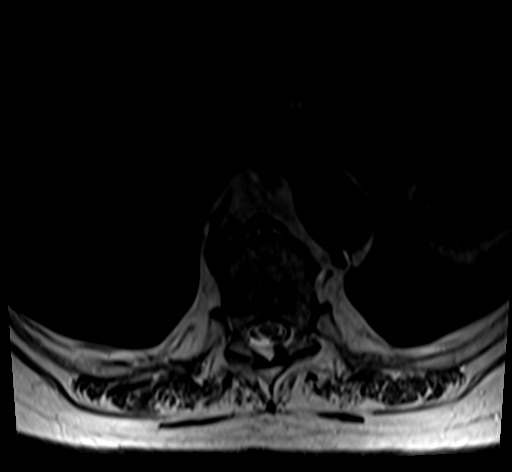
[im 21/49]
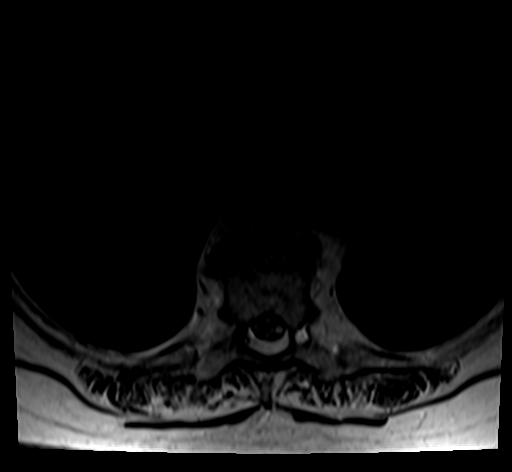
[im 25/49]
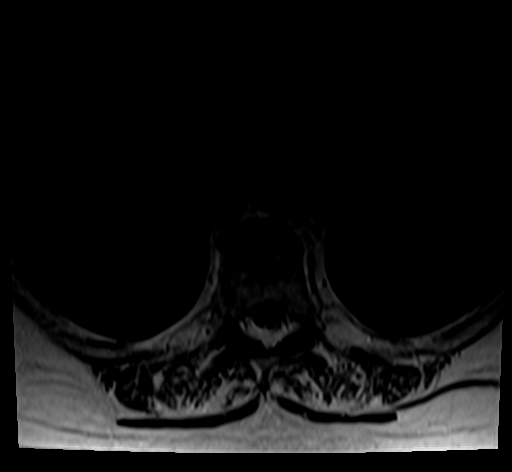
[im 28/49]
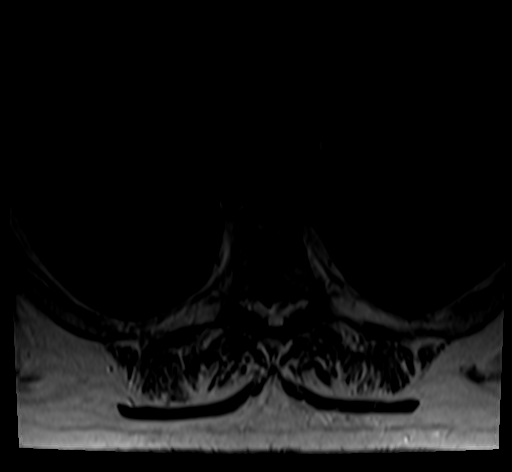
[im 35/49]
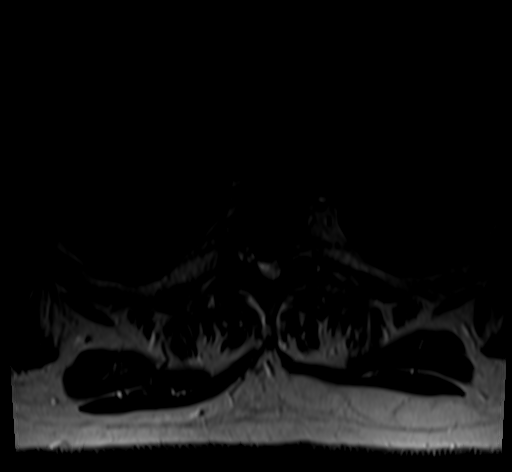
[im 42/49]
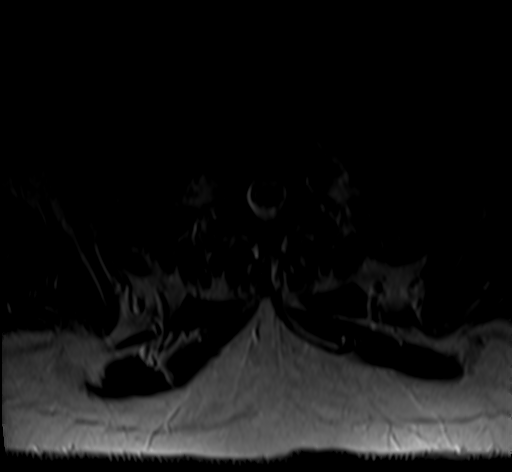
[im 49/49]
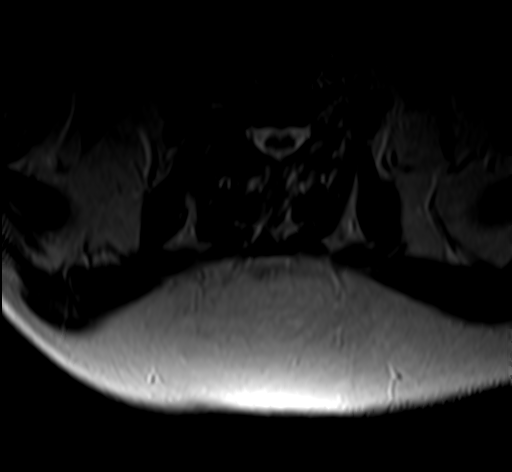

[21 of 48 positions shown; findings below may reference images not displayed]

FINDINGS: Alignment: Exaggerated thoracic kyphosis. Mild thoracic
dextroscoliosis. No listhesis.

Vertebrae: No fracture, suspicious marrow lesion, or significant
marrow edema. Mild chronic degenerative endplate changes in the mid
to lower thoracic spine.

Cord:  Normal signal and morphology.

Paraspinal and other soft tissues: Unremarkable.

Disc levels:

A small central disc protrusion at T4-5 and minimal disc bulging
from T5-6 to T8-9 do not result in significant spinal stenosis.

There is mild spinal stenosis at T9-10 due to left greater than
right facet hypertrophy, asymmetric left-sided ligamentum flavum
hypertrophy/calcification, and minimal disc bulging.

There is relatively widespread thoracic facet arthrosis including
moderate to severe facet arthrosis on the left at T1-2 and T6-7 and
on the right at T5-6 associated with mild neural foraminal stenosis.
IMPRESSION: 1. Mild multilevel thoracic disc degeneration with mild spinal
stenosis at T9-10.
2. Widespread thoracic facet arthrosis.

## 2021-02-04 IMAGING — MR MR LUMBAR SPINE W/O CM
4 of 5 series · 25 of 48 positions shown · non-contrast
Comparison: Lumbar spine radiographs [DATE]

CLINICAL DATA: Lumbar radiculopathy. Back and left leg pain.
Bilateral foot numbness.

EXAM:
MRI LUMBAR SPINE WITHOUT CONTRAST
TECHNIQUE: Multiplanar, multisequence MR imaging of the lumbar spine was
performed. No intravenous contrast was administered.

[Series 3: T1 · sagittal · 4.0mm · 0.53mm/px · 6 of 21 slices shown (1 of 2)]
[im 1/21]
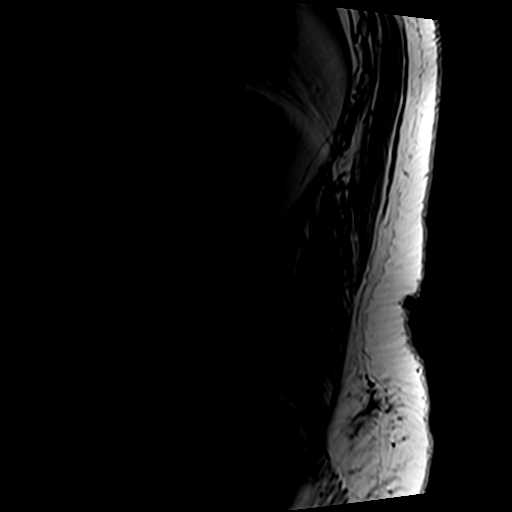
[im 3/21]
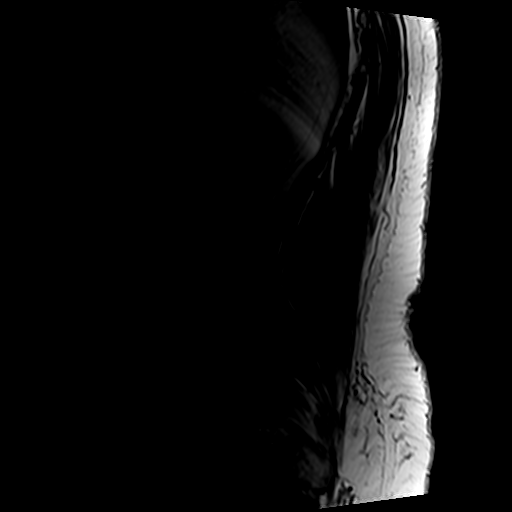
[im 6/21]
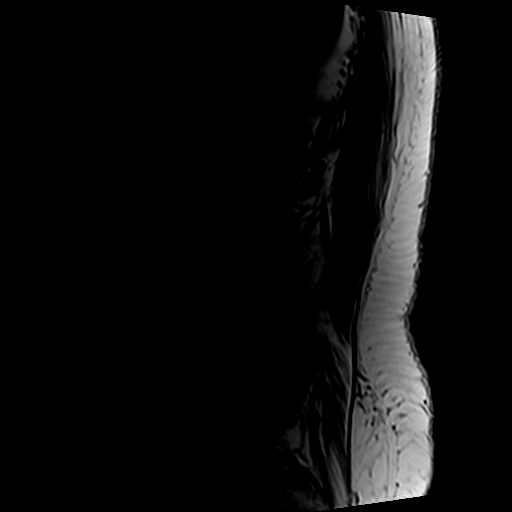
[im 9/21]
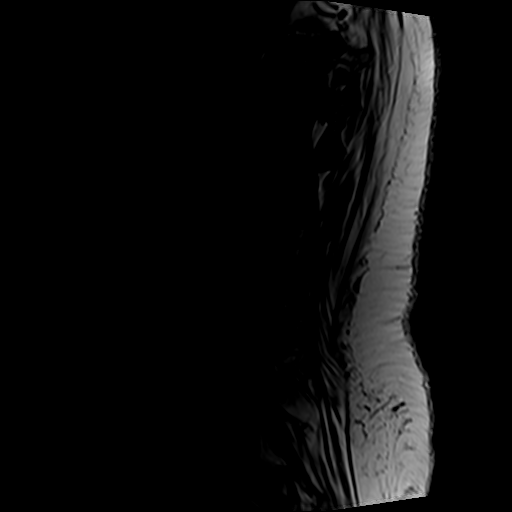
[im 12/21]
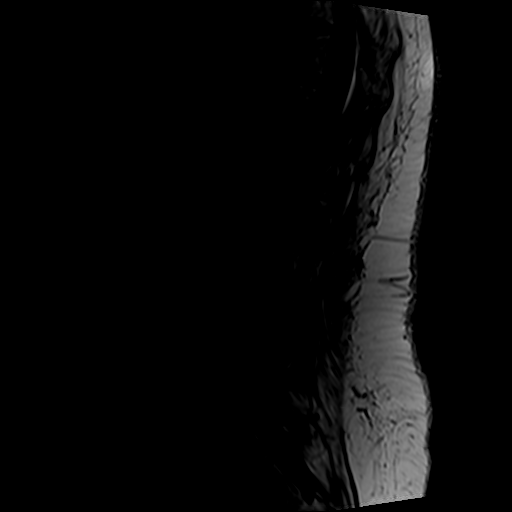
[im 18/21]
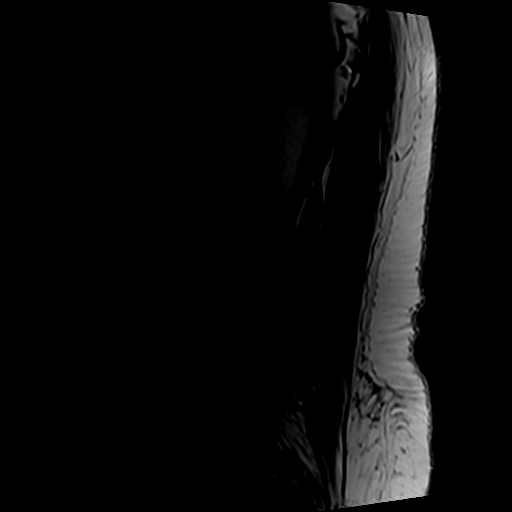

[Series 4: T2 · sagittal · 4.0mm · 0.53mm/px · 7 of 21 slices shown (1 of 2)]
[im 1/21]
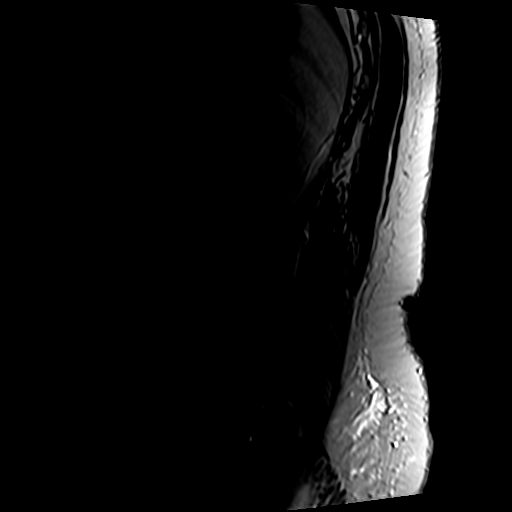
[im 4/21]
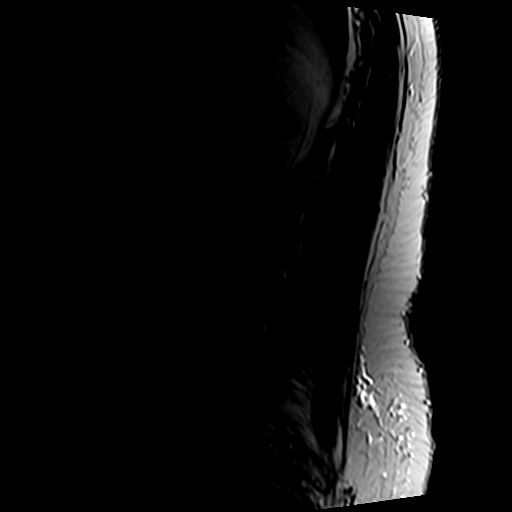
[im 7/21]
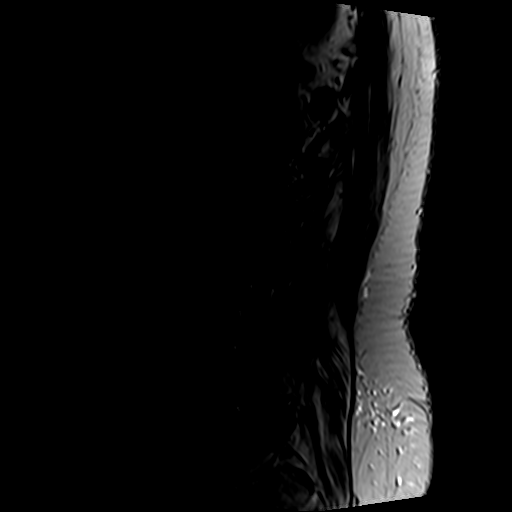
[im 11/21]
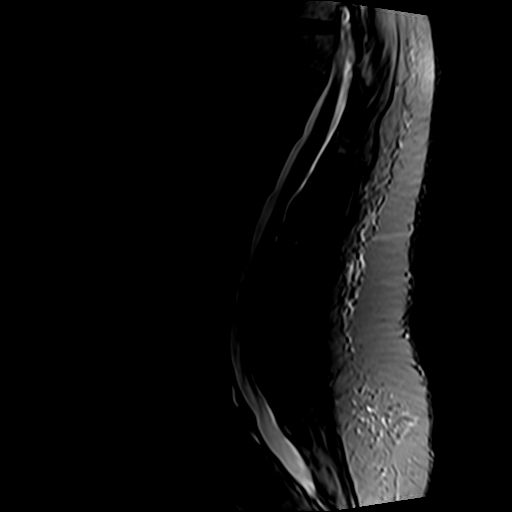
[im 14/21]
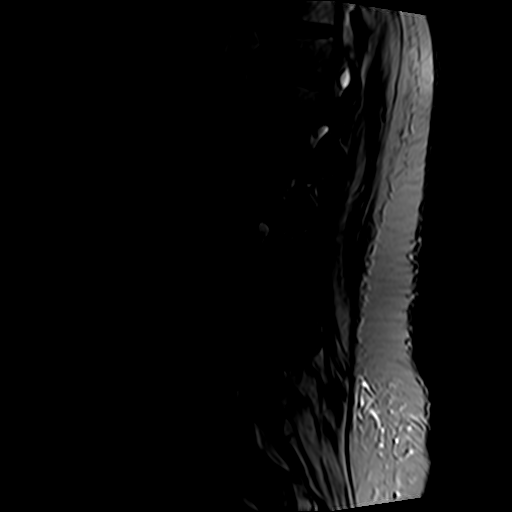
[im 17/21]
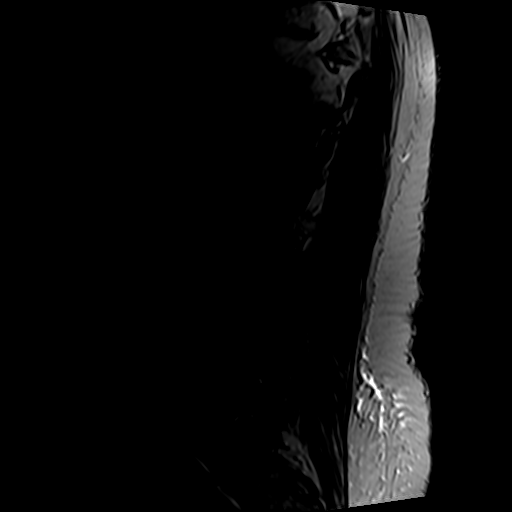
[im 21/21]
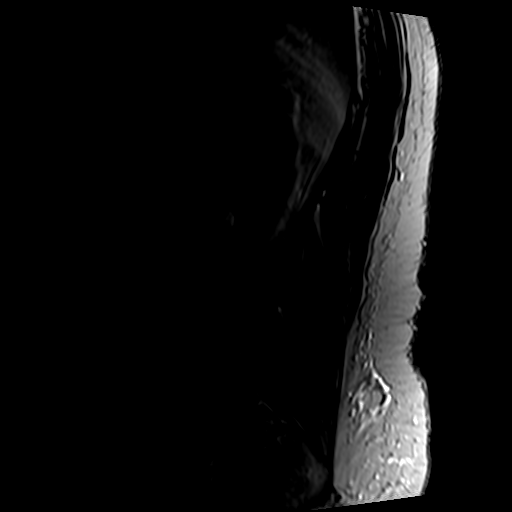

[Series 6: T2 · axial · 4.0mm · 0.78mm/px · z∈[-86,+113]mm · 9 of 39 slices shown (2 of 2)]
[im 1/39]
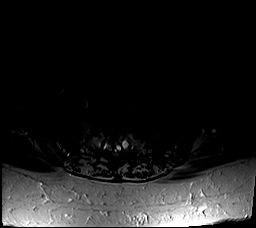
[im 7/39]
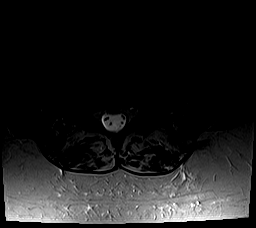
[im 13/39]
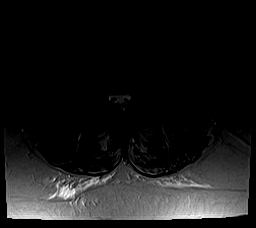
[im 16/39]
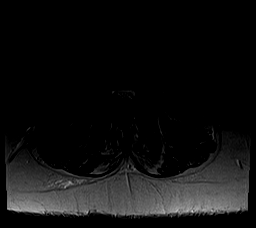
[im 20/39]
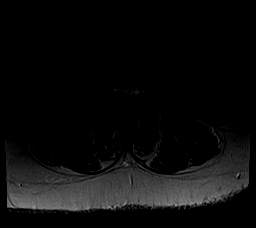
[im 23/39]
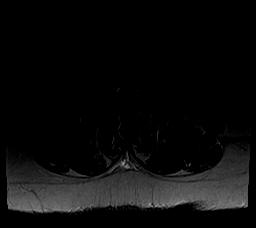
[im 26/39]
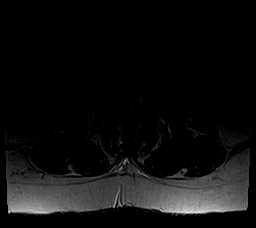
[im 32/39]
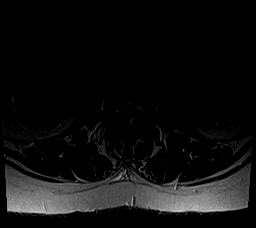
[im 39/39]
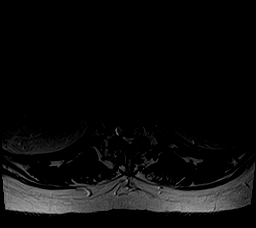

[Series 7: T1 · axial · 4.0mm · 0.39mm/px · z∈[-54,+76]mm · 3 of 39 slices shown (2 of 2)]
[im 7/39]
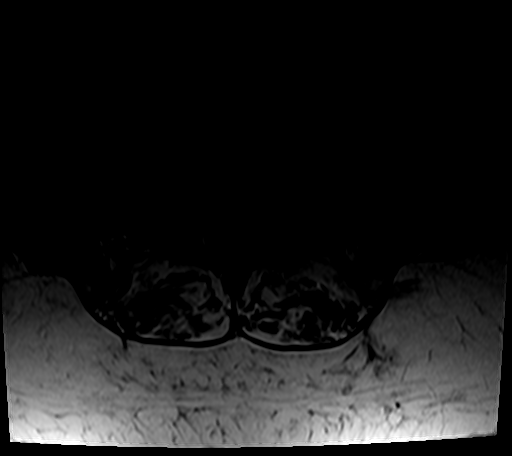
[im 20/39]
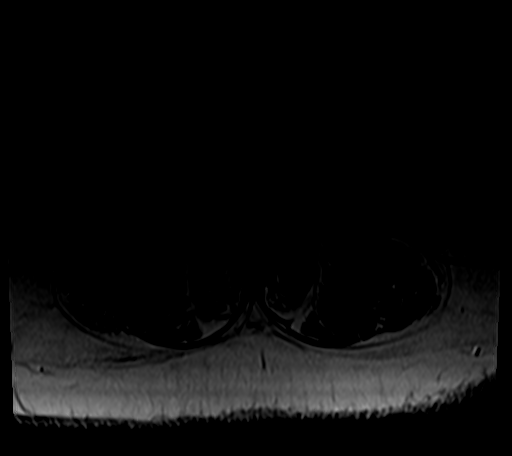
[im 32/39]
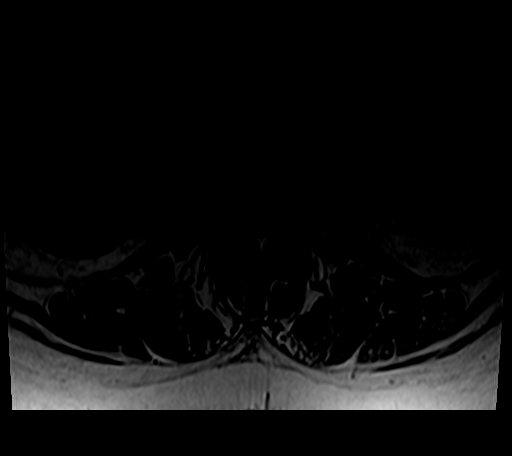

[25 of 48 positions shown; findings below may reference images not displayed]

FINDINGS: Segmentation:  Standard.

Alignment: Mild lumbar levoscoliosis. Trace anterolisthesis of L4 on
L5.

Vertebrae: No fracture or suspicious marrow lesion. Mild left-sided
facet edema at L4-5 and L5-S1 extending into the L5 and S1 pedicles.

Conus medullaris and cauda equina: Conus extends to the L1-2 level.
Conus and cauda equina appear normal.

Paraspinal and other soft tissues: Unremarkable.

Disc levels:

Disc desiccation throughout the lumbar spine.

L1-2: Minimal disc bulging and mild facet and ligamentum flavum
hypertrophy without stenosis.

L2-3: Mild disc bulging and mild facet and ligamentum flavum
hypertrophy without stenosis.

L3-4: Minimal disc bulging and mild facet and ligamentum flavum
hypertrophy without stenosis.

L4-5: Anterolisthesis with mild right eccentric bulging of uncovered
disc and severe facet arthrosis without stenosis.

L5-S1: Left eccentric disc bulging, a left central to left foraminal
disc protrusion, asymmetrically severe left sided disc space height
loss, and severe left facet arthrosis result in mild left lateral
recess stenosis and severe left neural foraminal stenosis with
potential left L5 nerve root impingement. The left S1 nerve root may
also be affected in the lateral recess. No spinal stenosis.
IMPRESSION: 1. Advanced left-sided disc and facet degeneration at L5-S1 with
severe left neural foraminal and mild left lateral recess stenosis.
2. Severe facet arthrosis at L4-5 with trace anterolisthesis. No
stenosis.

## 2021-02-04 IMAGING — MR MR CERVICAL SPINE W/O CM
4 of 5 series · 30 of 48 positions shown · non-contrast
Comparison: Cervical spine radiographs [DATE]

CLINICAL DATA: Cervical radiculopathy. Bilateral arm pain and
weakness.

EXAM:
MRI CERVICAL SPINE WITHOUT CONTRAST
TECHNIQUE: Multiplanar, multisequence MR imaging of the cervical spine was
performed. No intravenous contrast was administered.

[Series 3: T2 · sagittal · 3.0mm · 0.66mm/px · 8 of 19 slices shown (1 of 2)]
[im 1/19]
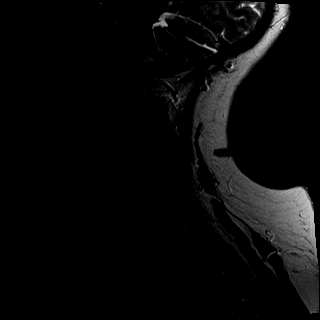
[im 3/19]
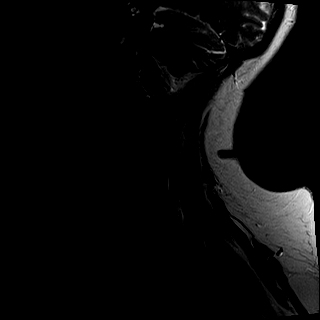
[im 6/19]
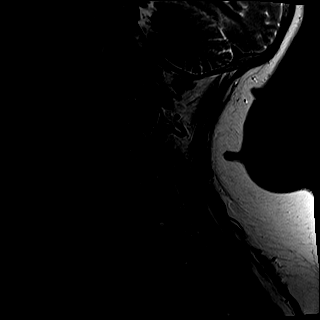
[im 8/19]
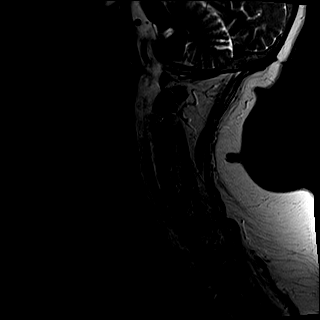
[im 11/19]
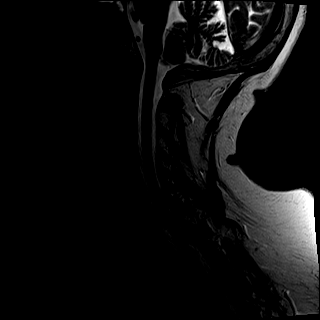
[im 13/19]
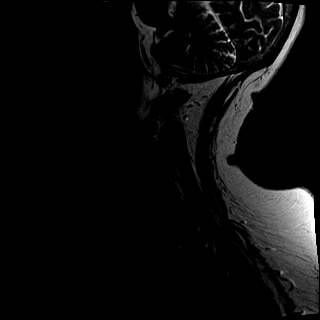
[im 16/19]
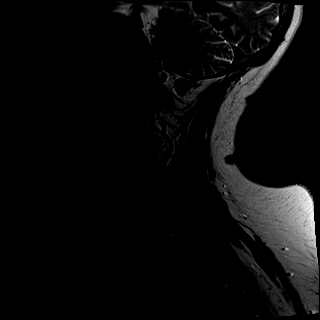
[im 19/19]
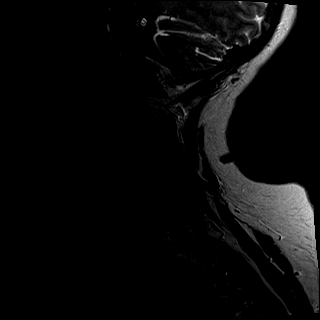

[Series 4: T1 · sagittal · 3.0mm · 0.41mm/px · 7 of 19 slices shown]
[im 1/19]
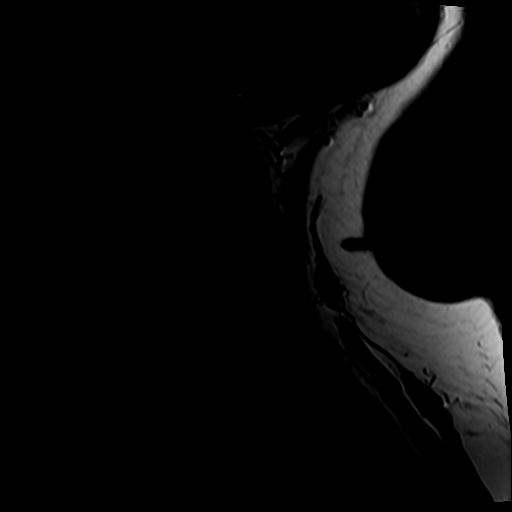
[im 4/19]
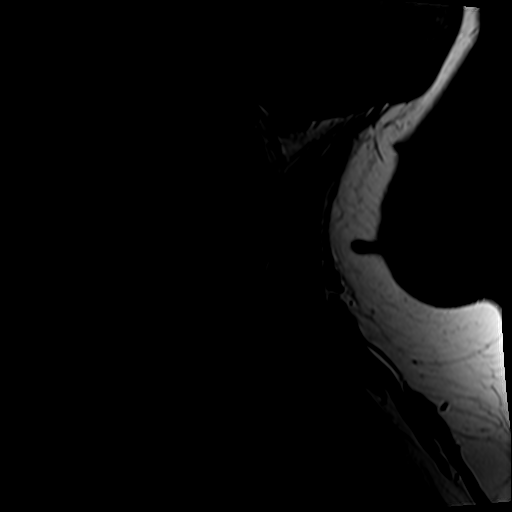
[im 7/19]
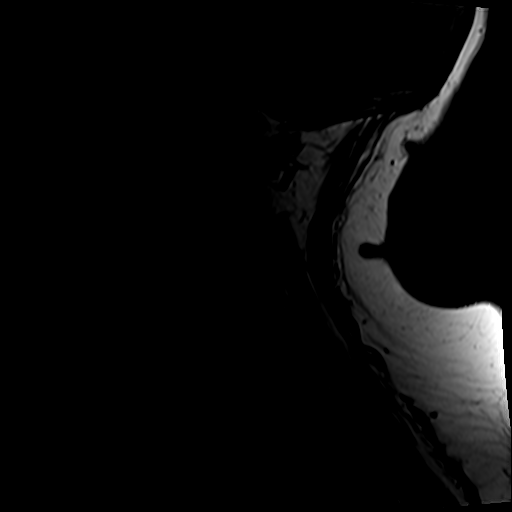
[im 10/19]
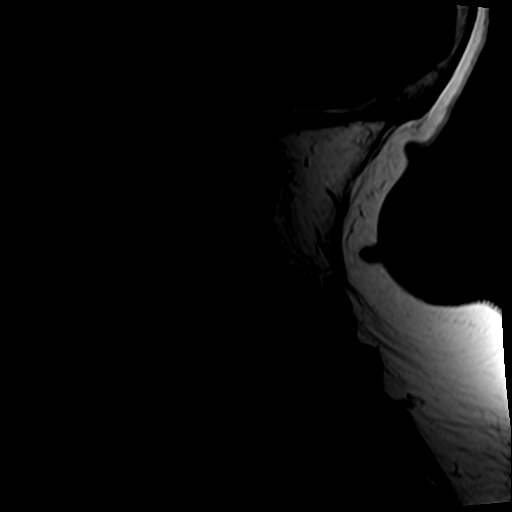
[im 13/19]
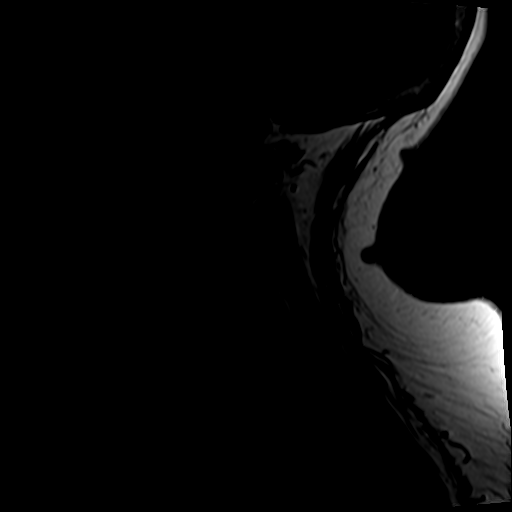
[im 16/19]
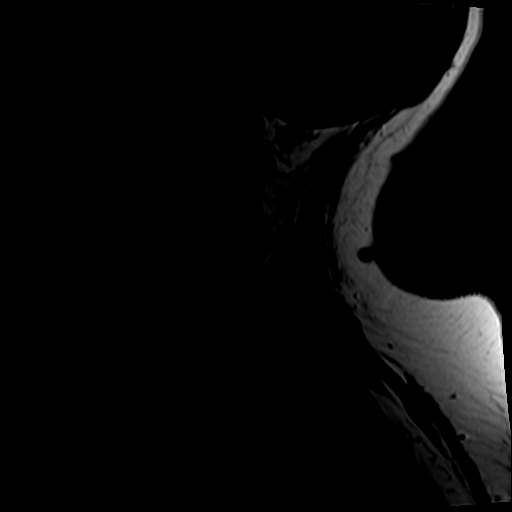
[im 19/19]
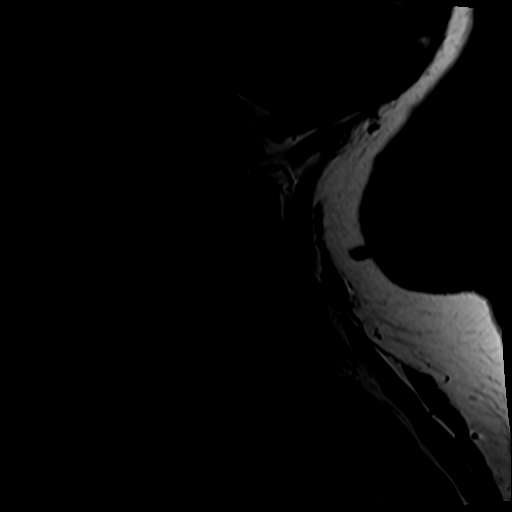

[Series 5: tir sag · sagittal · 3.0mm · 0.43mm/px · 6 of 19 slices shown]
[im 1/19]
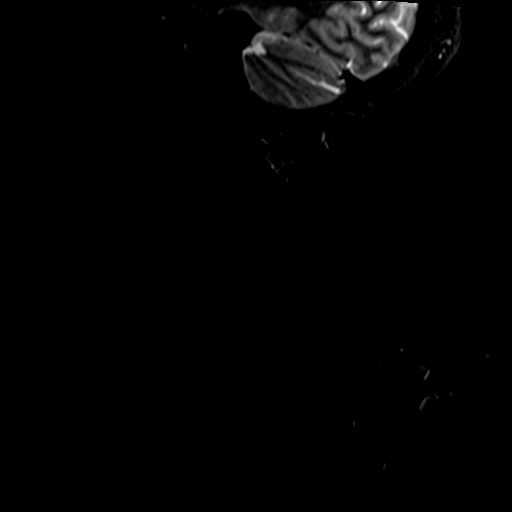
[im 4/19]
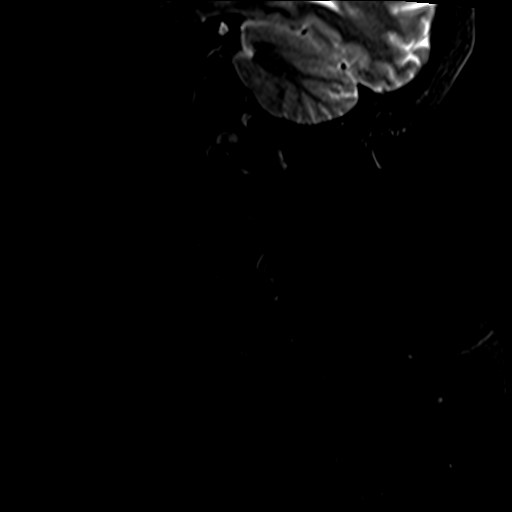
[im 7/19]
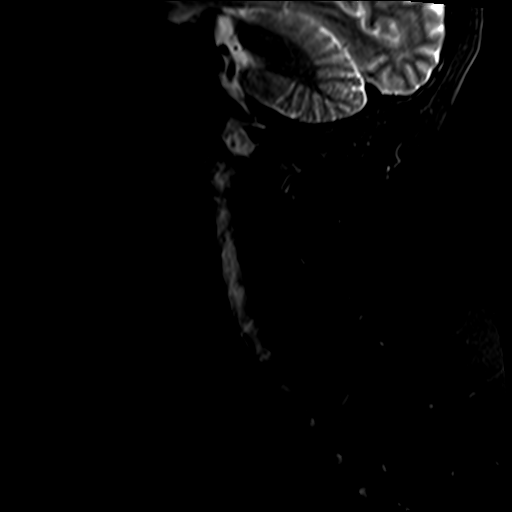
[im 10/19]
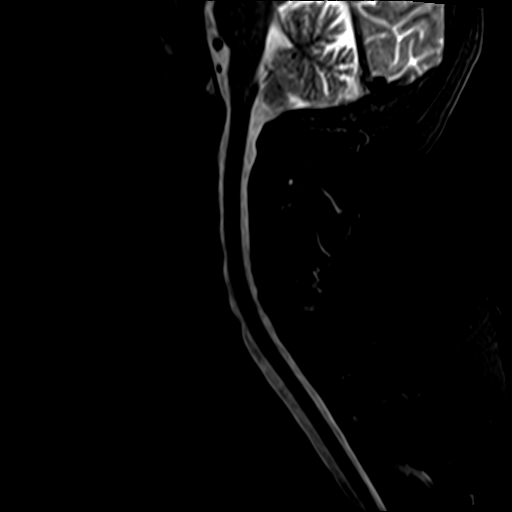
[im 13/19]
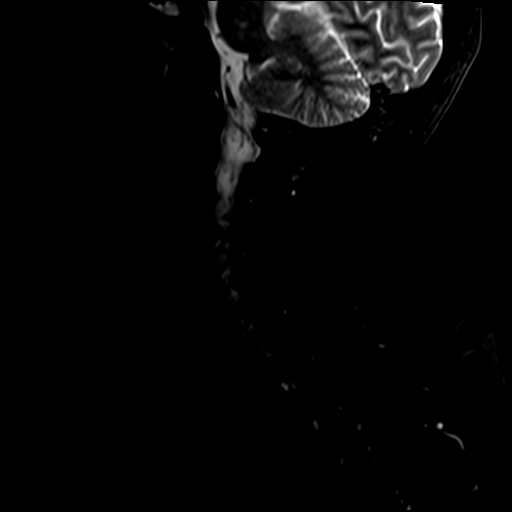
[im 16/19]
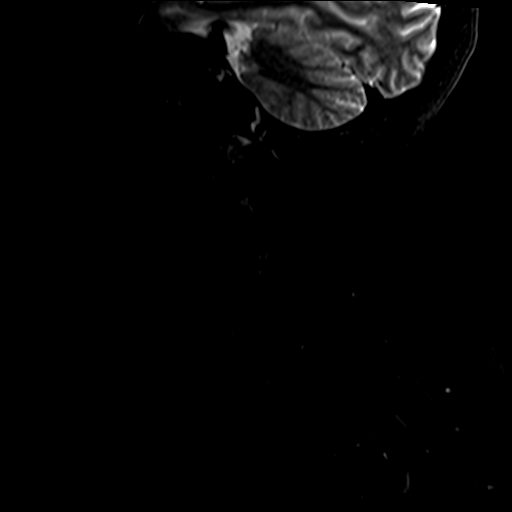

[Series 7: T2 · axial · 3.0mm · 0.70mm/px · z∈[-52,+76]mm · 9 of 36 slices shown (2 of 2)]
[im 1/36]
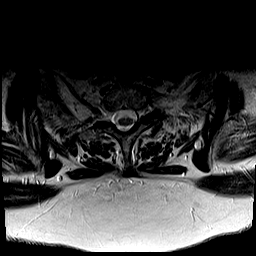
[im 6/36]
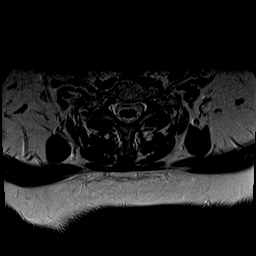
[im 12/36]
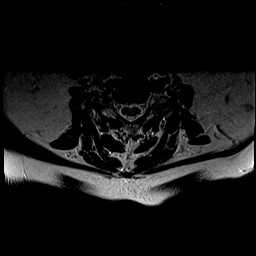
[im 15/36]
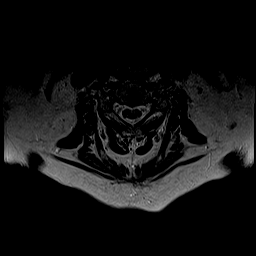
[im 18/36]
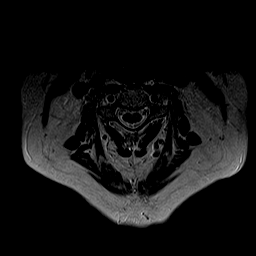
[im 21/36]
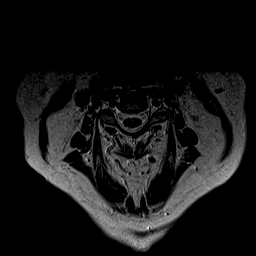
[im 24/36]
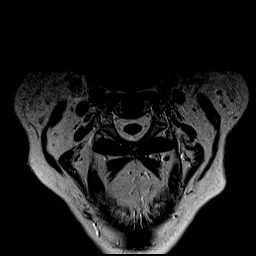
[im 30/36]
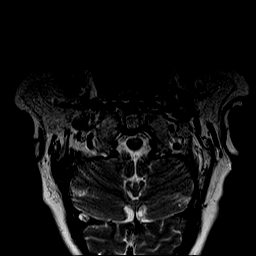
[im 36/36]
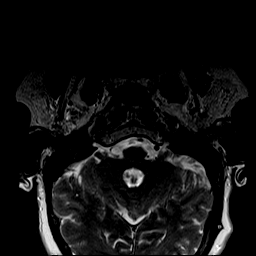

[30 of 48 positions shown; findings below may reference images not displayed]

FINDINGS: Alignment: Normal.

Vertebrae: No fracture, suspicious marrow lesion, or significant
marrow edema.

Cord: Normal signal and morphology.

Posterior Fossa, vertebral arteries, paraspinal tissues: Minimal T2
hyperintensity in the pons, nonspecific though most often seen with
chronic small vessel ischemia. Preserved vertebral artery flow
voids.

Disc levels:

C2-3: Negative.

C3-4: Shallow central disc protrusion and mild right facet arthrosis
without stenosis.

C4-5: Small central disc protrusion without stenosis.

C5-6: Minimal disc bulging and minimal uncovertebral spurring
without stenosis.

C6-7: Mild disc bulging without stenosis.

C7-T1: Mild facet arthrosis without disc herniation or stenosis.
IMPRESSION: Mild cervical spondylosis without stenosis.

## 2021-03-21 ENCOUNTER — Other Ambulatory Visit (HOSPITAL_COMMUNITY): Payer: Self-pay | Admitting: Nurse Practitioner

## 2021-03-21 DIAGNOSIS — Z780 Asymptomatic menopausal state: Secondary | ICD-10-CM

## 2021-03-21 DIAGNOSIS — Z1231 Encounter for screening mammogram for malignant neoplasm of breast: Secondary | ICD-10-CM

## 2021-03-21 DIAGNOSIS — M8580 Other specified disorders of bone density and structure, unspecified site: Secondary | ICD-10-CM

## 2021-03-30 ENCOUNTER — Other Ambulatory Visit (HOSPITAL_COMMUNITY): Payer: Medicare Other

## 2021-05-23 ENCOUNTER — Other Ambulatory Visit: Payer: Self-pay

## 2021-05-23 ENCOUNTER — Ambulatory Visit (HOSPITAL_COMMUNITY)
Admission: RE | Admit: 2021-05-23 | Discharge: 2021-05-23 | Disposition: A | Discharge status: Home / Self Care | Payer: Medicare Other | Source: Ambulatory Visit | Attending: Nurse Practitioner | Admitting: Nurse Practitioner

## 2021-05-23 DIAGNOSIS — F17200 Nicotine dependence, unspecified, uncomplicated: Secondary | ICD-10-CM | POA: Diagnosis not present

## 2021-05-23 DIAGNOSIS — Z90711 Acquired absence of uterus with remaining cervical stump: Secondary | ICD-10-CM | POA: Diagnosis not present

## 2021-05-23 DIAGNOSIS — Z13820 Encounter for screening for osteoporosis: Secondary | ICD-10-CM | POA: Diagnosis not present

## 2021-05-23 DIAGNOSIS — Z1231 Encounter for screening mammogram for malignant neoplasm of breast: Secondary | ICD-10-CM | POA: Insufficient documentation

## 2021-05-23 DIAGNOSIS — M8589 Other specified disorders of bone density and structure, multiple sites: Secondary | ICD-10-CM | POA: Insufficient documentation

## 2021-05-23 DIAGNOSIS — Z780 Asymptomatic menopausal state: Secondary | ICD-10-CM | POA: Insufficient documentation

## 2021-05-23 IMAGING — MG MM DIGITAL SCREENING BILAT W/ TOMO AND CAD
8 series · 8 of 24 positions shown · non-contrast
Comparison: Previous exam(s).

CLINICAL DATA: Screening.

EXAM:
DIGITAL SCREENING BILATERAL MAMMOGRAM WITH TOMOSYNTHESIS AND CAD
TECHNIQUE: Bilateral screening digital craniocaudal and mediolateral oblique
mammograms were obtained. Bilateral screening digital breast
tomosynthesis was performed. The images were evaluated with
computer-aided detection.

[L CC synth-2D]
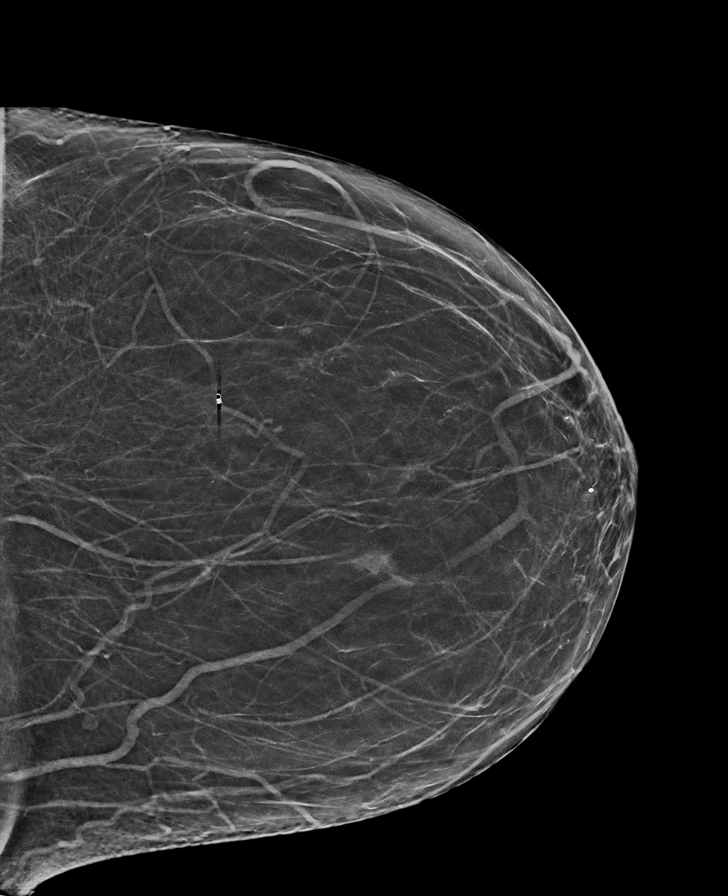

[R CC synth-2D]
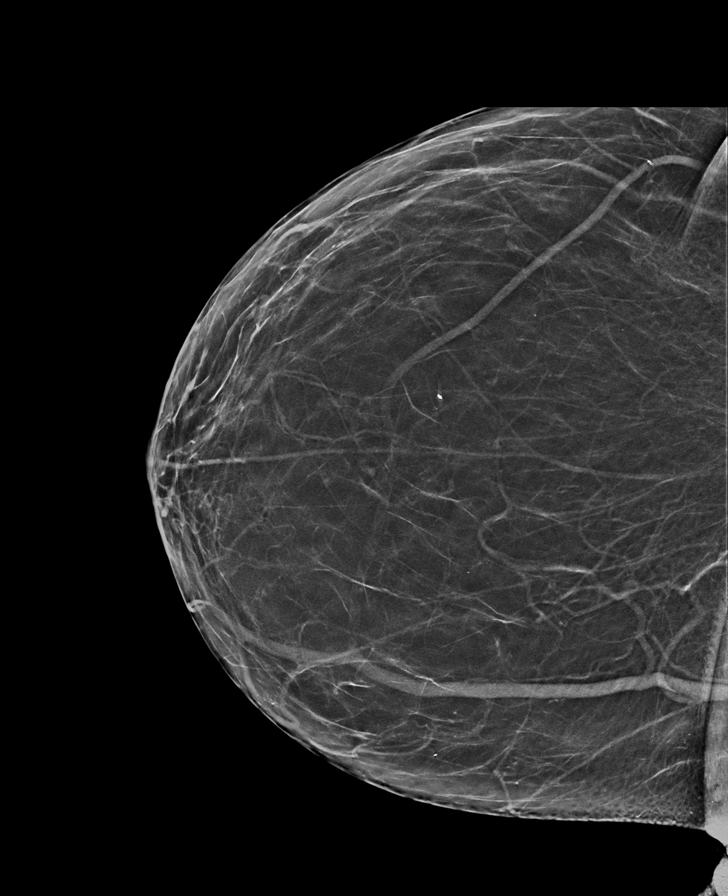

[R MLO synth-2D]
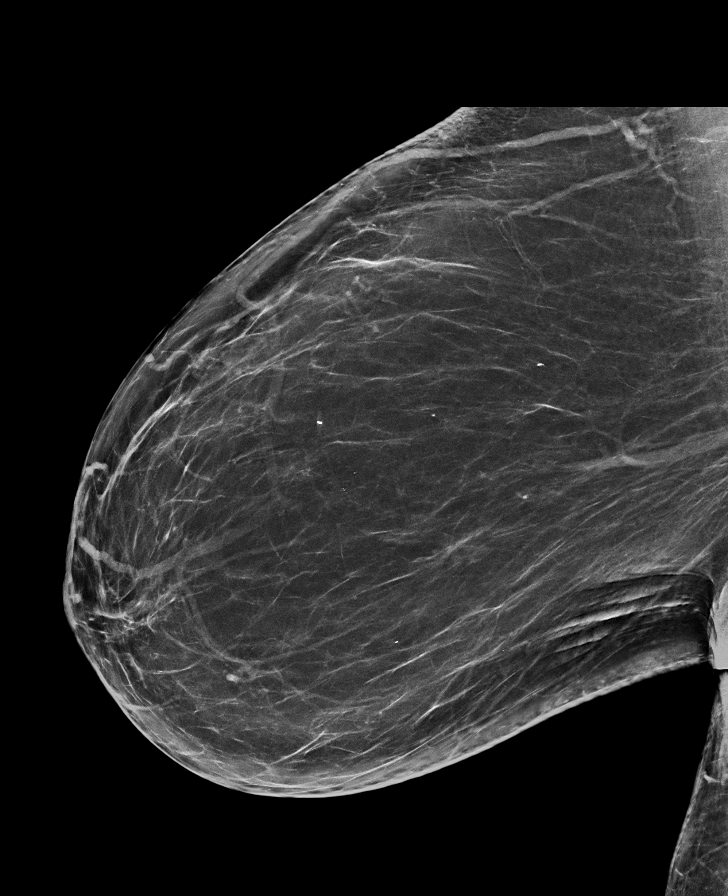

[L MLO synth-2D]
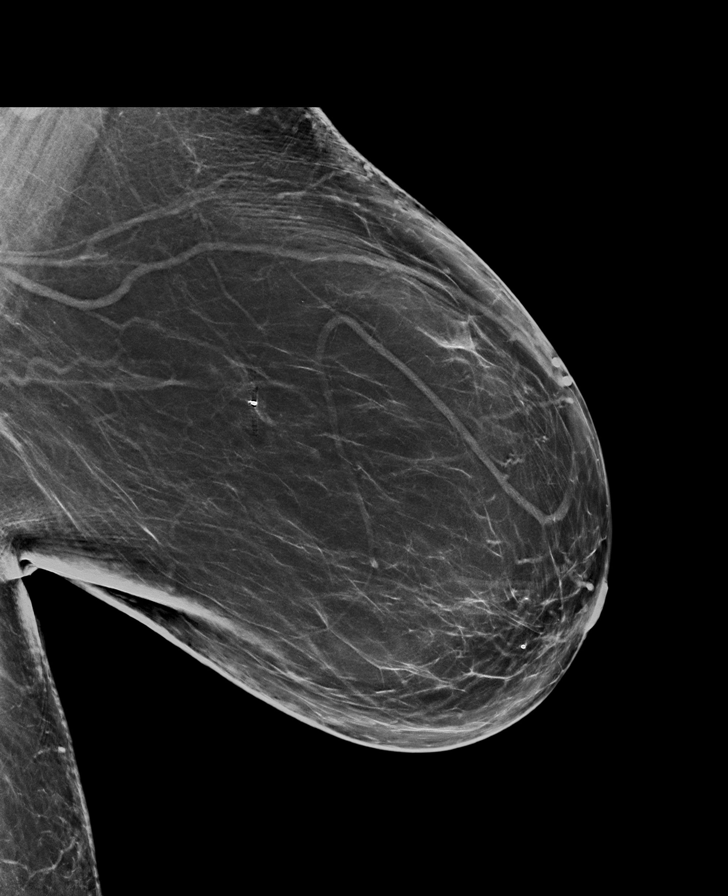

[R MLO tomo · tomo slice 39/77.0]
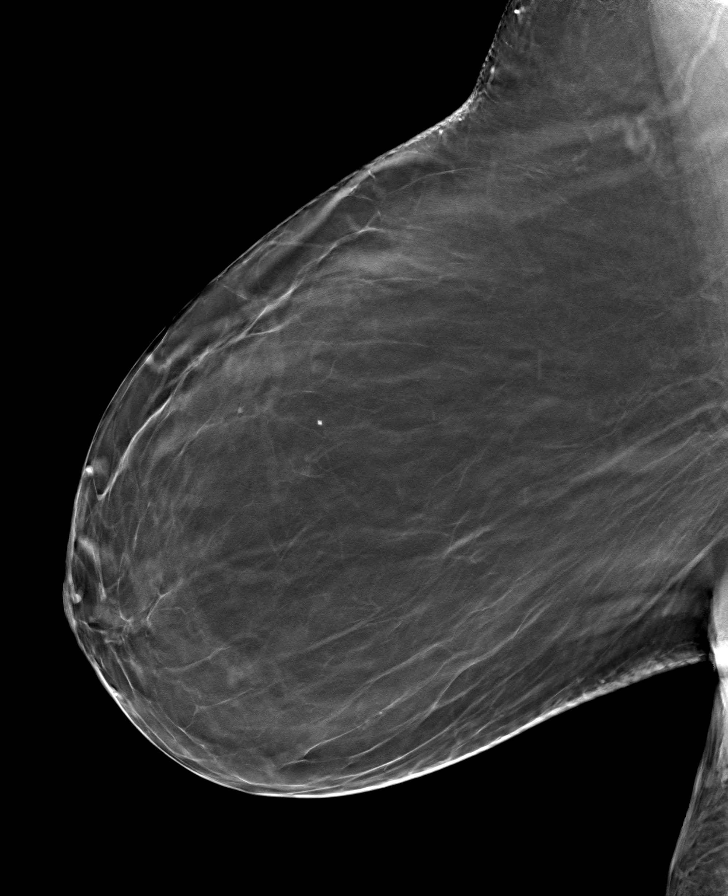

[R CC tomo · tomo slice 34/67.0]
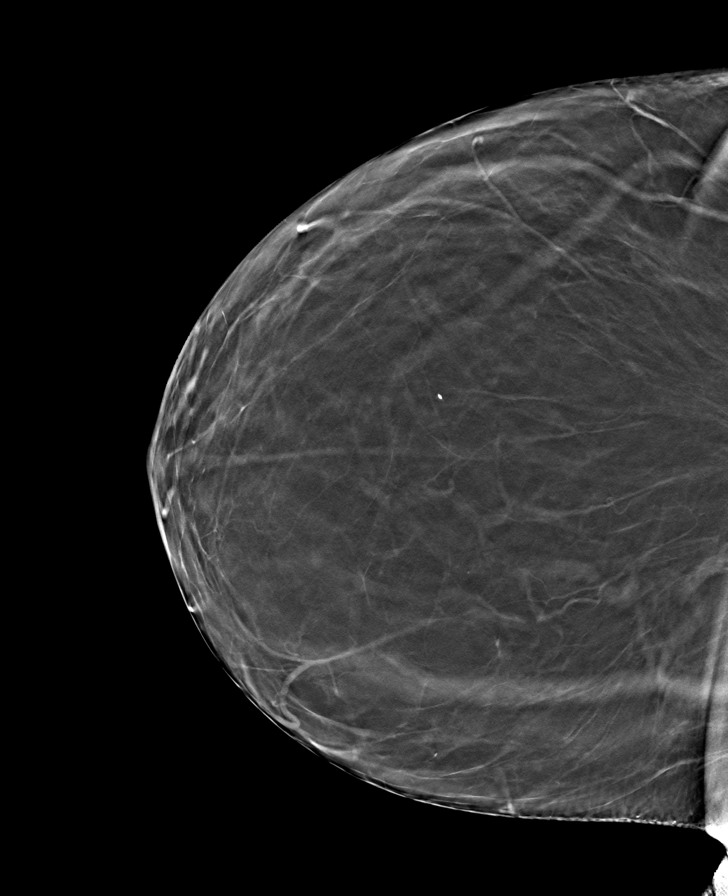

[L MLO tomo · tomo slice 45/90.0]
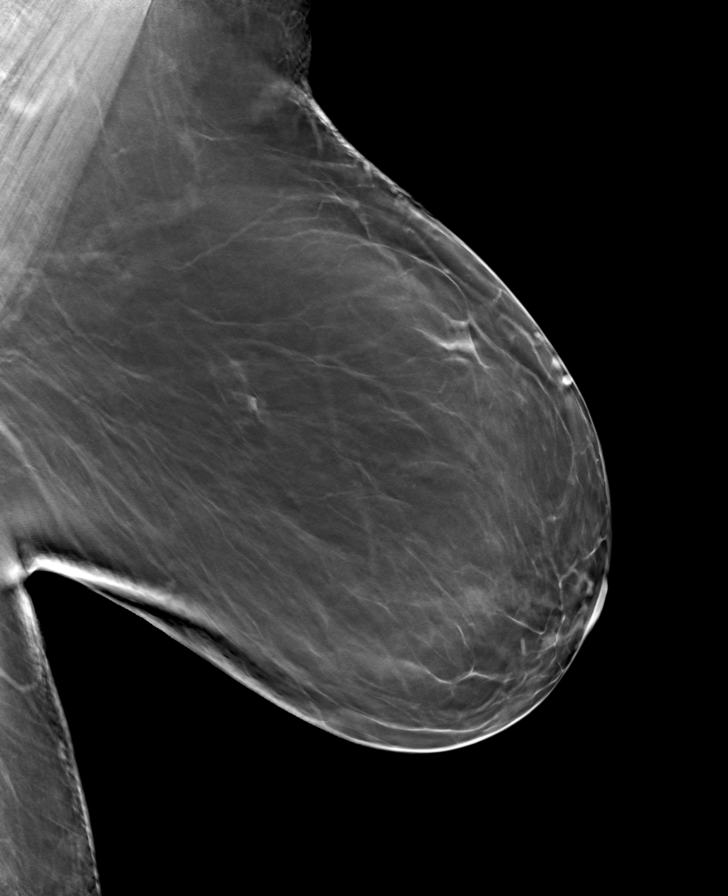

[L CC tomo · tomo slice 33/64.0]
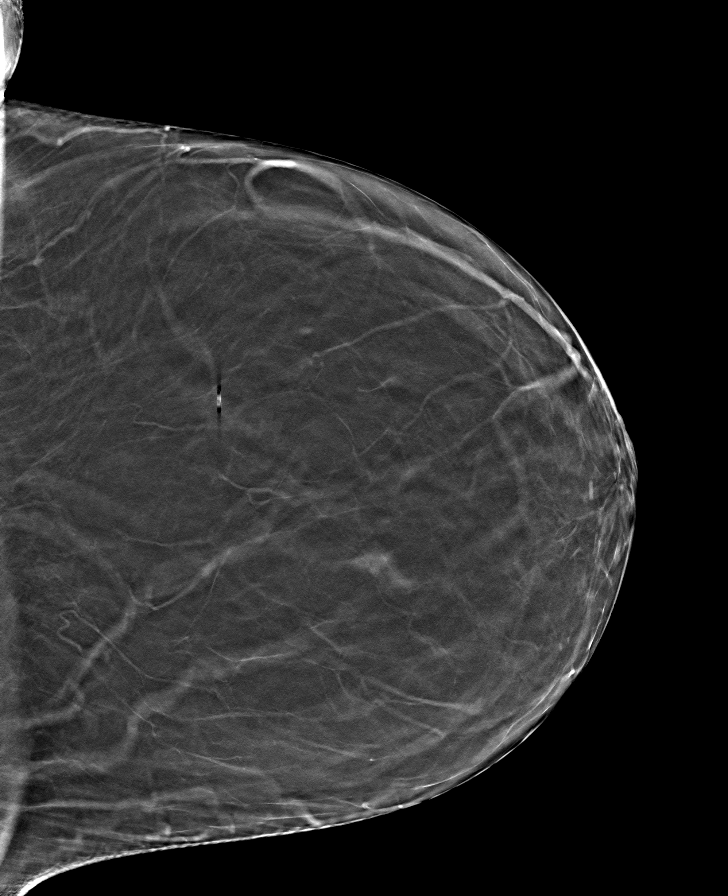

[8 of 24 positions shown; findings below may reference images not displayed]

ACR Breast Density Category b: There are scattered areas of
fibroglandular density.
FINDINGS: There are no findings suspicious for malignancy.
IMPRESSION: No mammographic evidence of malignancy. A result letter of this
screening mammogram will be mailed directly to the patient.

RECOMMENDATION:
Screening mammogram in one year. (Code:[BY])

BI-RADS CATEGORY  1: Negative.

## 2021-09-28 ENCOUNTER — Ambulatory Visit (INDEPENDENT_AMBULATORY_CARE_PROVIDER_SITE_OTHER): Payer: Medicare Other | Admitting: Pulmonary Disease

## 2021-09-28 ENCOUNTER — Encounter: Payer: Self-pay | Admitting: Pulmonary Disease

## 2021-09-28 ENCOUNTER — Other Ambulatory Visit: Payer: Self-pay

## 2021-09-28 VITALS — BP 138/74 | HR 92 | Temp 98.5°F | Ht 66.0 in | Wt 223.0 lb

## 2021-09-28 DIAGNOSIS — G4719 Other hypersomnia: Secondary | ICD-10-CM

## 2021-09-28 DIAGNOSIS — Z720 Tobacco use: Secondary | ICD-10-CM

## 2021-09-28 DIAGNOSIS — R053 Chronic cough: Secondary | ICD-10-CM | POA: Diagnosis not present

## 2021-09-28 DIAGNOSIS — F17210 Nicotine dependence, cigarettes, uncomplicated: Secondary | ICD-10-CM | POA: Diagnosis not present

## 2021-09-28 MED ORDER — FLUTICASONE FUROATE-VILANTEROL 200-25 MCG/ACT IN AEPB: 1.0000 | INHALATION_SPRAY | Freq: Every day | RESPIRATORY_TRACT | 5 refills | Status: DC

## 2021-09-28 NOTE — Patient Instructions (Signed)
Shortness of breath, cough, wheezing Concerned for possible COPD --ARRANGE for pulmonary function tests --START Breo 200-25 mcg ONE puff ONCE a day  Excessive daytime sleepiness Polycythemia, HTN --ORDER split night study  Tobacco abuse Patient is an active smoker. We discussed smoking cessation for 5 minutes. We discussed triggers and stressors and ways to deal with them. We discussed barriers to continued smoking and benefits of smoking cessation. Provided patient with information cessation techniques and interventions including [place] quitline.  Follow-up with me in March or April with PFTs prior to visit

## 2021-09-28 NOTE — Progress Notes (Signed)
Subjective:   PATIENT ID: Christina Whitehead GENDER: female DOB: 29-Nov-1955, MRN: [id number]   HPI  Chief Complaint  Patient presents with   Consult    Breathing     Reason for Visit: New consult for shortness of breath  [name] is a 66 year old female active smoker with HTN and DM2 who presents as a new consult.   She was planned for surgery for back stimulator. During anesthesia evaluation on 07/29/21, procedure was deferred due to nadir 91%. She reports she has difficulty taking deep breath. She has productive coughing with thick sputum and wheezing that occurs at night and when laying down. She will awaken 1-2 nights a week.   She reports excessive daytime sleepiness. She will fall asleep when left alone and when sitting and reading/watching TV. Husband reports she snores but no witnessed apnea. Had sleep study >5 years ago but was negative. Denies weight gain since then.  Social History: Active smoker. Started at 14. 60 pack-years. Currently smoking 1.5 ppd. 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  Past Medical History:  Diagnosis Date   Anxiety    Diabetes mellitus without complication (HCC)    Hypertension    Neuropathy      Family History  Problem Relation Age of Onset   Stroke Mother    Heart disease Mother    Renal cancer Mother    Breast cancer Maternal Aunt      Social History   Occupational History   Not on file  Tobacco Use   Smoking status: Every Day    Packs/day: 1.50    Years: 40.00    Pack years: 60.00    Types: Cigarettes   Smokeless tobacco: Never  Vaping Use   Vaping Use: Never used  Substance and Sexual Activity   Alcohol use: No   Drug use: No   Sexual activity: Not on file    Allergies  Allergen Reactions   Penicillins Hives    Rash Has patient had a PCN reaction causing immediate rash, facial/tongue/throat  swelling, SOB or lightheadedness with hypotension: YES Has patient had a PCN reaction causing severe rash involving mucus membranes or skin necrosis: NO Has patient had a PCN reaction that required hospitalization: NO Has patient had a PCN reaction occurring within the last 10 years: NO} If all of the above answers are "NO", then may proceed with Cephalosporin use.      Outpatient Medications Prior to Visit  Medication Sig Dispense Refill   busPIRone (BUSPAR) 10 MG tablet Take 10 mg by mouth 2 (two) times daily.      gabapentin (NEURONTIN) 300 MG capsule TAKE THREE CAPSULES BY MOUTH THREE TIMES A DAY     hydrochlorothiazide (HYDRODIURIL) 25 MG tablet Take 1 tablet (25 mg total) by mouth daily. 90 tablet 3   metFORMIN (GLUCOPHAGE) 1000 MG tablet Take 1,000 mg by mouth 2 (two) times daily with a meal.     aspirin 81 MG chewable tablet Chew 81 mg by mouth daily. (Patient not taking: Reported on 09/28/2021)     calcium carbonate (OSCAL) 1500 (600 Ca) MG TABS tablet Take by mouth.     lisinopril (PRINIVIL,ZESTRIL) 2.5 MG tablet TK 1 T PO QD (Patient not taking: Reported on 09/28/2021)  0   predniSONE (DELTASONE) 10 MG tablet Take 2 tablets (20 mg total) by mouth daily. (Patient not taking: Reported on 09/28/2021) 15 tablet  0   traZODone (DESYREL) 50 MG tablet Take 50 mg by mouth daily.  (Patient not taking: Reported on 09/28/2021)     No facility-administered medications prior to visit.    Review of Systems  Constitutional:  Negative for chills, diaphoresis, fever, malaise/fatigue and weight loss.  HENT:  Negative for congestion, ear pain and sore throat.   Respiratory:  Positive for cough, sputum production and shortness of breath. Negative for hemoptysis and wheezing.   Cardiovascular:  Negative for chest pain, palpitations and leg swelling.  Gastrointestinal:  Negative for abdominal pain, heartburn and nausea.  Genitourinary:  Negative for frequency.  Musculoskeletal:  Negative for joint pain and  myalgias.  Skin:  Negative for itching and rash.  Neurological:  Negative for dizziness, weakness and headaches.  Endo/Heme/Allergies:  Does not bruise/bleed easily.  Psychiatric/Behavioral:  Positive for depression. The patient is nervous/anxious.     Objective:   Vitals:   09/28/21 1552  BP: 138/74  Pulse: 92  Temp: 98.5 F (36.9 C)  TempSrc: Oral  SpO2: 94%  Weight: 223 lb (101.2 kg)  Height: 5\' 6"  (1.676 m)   SpO2: 94 % O2 Device: None (Room air)  Physical Exam: General: Well-appearing, no acute distress HENT: [place], AT, OP clear, MMM Eyes: EOMI, no scleral icterus Respiratory: Clear to auscultation bilaterally.  No crackles, wheezing or rales Cardiovascular: RRR, -M/R/G, no JVD GI: BS+, soft, nontender Extremities:-Edema,-tenderness Neuro: AAO x4, CNII-XII grossly intact Skin: Intact, no rashes or bruising Psych: Normal mood, normal affect  Data Reviewed:  Imaging:  PFT: None of file  Labs: Absolute eos 03/18/21 - 200   WBC 3.4 - 10.8 x10E3/uL 7.6   RBC 3.77 - 5.28 x10E6/uL 5.37 High    Hemoglobin 11.1 - 15.9 g/dL 16.7 High    Hematocrit 34.0 - 46.6 % 50.0 High    MCV 79 - 97 fL 93   MCH 26.6 - 33.0 pg 31.1   MCHC 31.5 - 35.7 g/dL 33.4   RDW 11.7 - 15.4 % 13.0   Platelet Count 150 - 450 x10E3/uL 242   Neutrophils Not Estab. % 48   Lymphs Relative  Not Estab. % 38   Monocytes Not Estab. % 10   Eos Relative  Not Estab. % 3   Basos Relative  Not Estab. % 1   Neutrophils Absolute 1.4 - 7.0 x10E3/uL 3.7   Lymphocytes Absolute 0.7 - 3.1 x10E3/uL 2.9   Monocytes Absolute 0.1 - 0.9 x10E3/uL 0.7   Eosinophils Absolute 0.0 - 0.4 x10E3/uL 0.2   Basophils Absolute 0.0 - 0.2 x10E3/uL 0.1   Immature Granulocytes Not Estab. % 0   Immature Grans (Abs) 0.0 - 0.1 x10E3/uL 0.0   Resulting Agency  [facility]     Polycythemia Assessment & Plan:   Discussion: 66 year old female active smoker with HTN and DM2 who presents as a new consult for shortness of breath and  concern for exertional hypoxemia. Ambulatory O2 in-clinic with no desaturations however nadir SpO2 91%. After review of history and data, I am concerned about undiagnosed sleep apnea. We will also evaluate her symptoms with PFTs as I suspected she has underlying obstructive lung disease that warrants treatment. Discussed clinical course and management of COPD including smoking cessation, bronchodilator regimen and action plan for exacerbation.  Shortness of breath, cough, wheezing Concerned for possible COPD --ARRANGE for pulmonary function tests --START Breo 200-25 mcg ONE puff ONCE a day  Excessive daytime sleepiness Concerned for OSA in setting of snoring, low-normal sats,  polycythemia, HTN STOPBANG score 5 - high risk of OSA --ORDER split night study  Tobacco abuse Patient is an active smoker. We discussed smoking cessation for 5 minutes. We discussed triggers and stressors and ways to deal with them. We discussed barriers to continued smoking and benefits of smoking cessation. Provided patient with information cessation techniques and interventions including [place] quitline.   Health Maintenance Immunization History  Administered Date(s) Administered   Moderna Sars-Covid-2 Vaccination 10/29/2019, 11/26/2019, 07/12/2020   CT Lung Screen - Refer at next visit  Orders Placed This Encounter  Procedures   Pulmonary function test    Standing Status:   Future    Standing Expiration Date:   09/28/2022    Order Specific Question:   Where should this test be performed?    Answer:   [facility]    Order Specific Question:   Full PFT: includes the following: basic spirometry, spirometry pre & post bronchodilator, diffusion capacity (DLCO), lung volumes    Answer:   Full PFT   Split night study    To be completed before Ov w/JE 10/27/21    Standing Status:   Future    Standing Expiration Date:   09/28/2022    Order Specific Question:   Where should this test be performed:    Answer:   [facility] ordered this encounter  Medications   fluticasone furoate-vilanterol (BREO ELLIPTA) 200-25 MCG/ACT AEPB    Sig: Inhale 1 puff into the lungs daily.    Dispense:  60 each    Refill:  5    Return in about 1 month (around 10/26/2021).  I have spent a total time of 45-minutes on the day of the appointment reviewing prior documentation, coordinating care and discussing medical diagnosis and plan with the patient/family. Imaging, labs and tests included in this note have been reviewed and interpreted independently by me.  [place], MD [facility] 09/28/2021 4:13 PM  Office Number [phone]

## 2021-09-30 ENCOUNTER — Telehealth: Payer: Self-pay | Admitting: Pulmonary Disease

## 2021-09-30 MED ORDER — FLUTICASONE-SALMETEROL 250-50 MCG/ACT IN AEPB: 1.0000 | INHALATION_SPRAY | Freq: Two times a day (BID) | RESPIRATORY_TRACT | 5 refills | Status: DC

## 2021-09-30 NOTE — Telephone Encounter (Signed)
I have ordered Advair 250-16mcg ONE puff TWICE a day.  Please notify patient of change of inhaler and how it is taken. If this is still not an appropriate cost, please have her contact our office again.

## 2021-09-30 NOTE — Telephone Encounter (Signed)
Spoke with pt who states [facility] is over 400.00 and pt is unsure if she can afford it. Dr. Loanne Drilling can you please advise on possible change in inhaler.

## 2021-10-03 ENCOUNTER — Encounter: Payer: Self-pay | Admitting: Pulmonary Disease

## 2021-10-03 NOTE — Progress Notes (Deleted)
Breo not covered. ORDER Advair Diskus 250-50 mcg ONE puff TWICE a day

## 2021-10-03 NOTE — Telephone Encounter (Signed)
Called and spoke with pt letting her know the info per JE and she verbalized understanding. Nothing further needed. 

## 2021-10-04 ENCOUNTER — Telehealth: Payer: Self-pay | Admitting: Pulmonary Disease

## 2021-10-04 MED ORDER — FLUTICASONE-SALMETEROL 250-50 MCG/ACT IN AEPB: 1.0000 | INHALATION_SPRAY | Freq: Two times a day (BID) | RESPIRATORY_TRACT | 5 refills | Status: DC

## 2021-10-04 NOTE — Telephone Encounter (Signed)
Spoke with patient.  Patient stated [facility] had not received her Advair prescription from Dr. Everardo All.  Advair prescription was sent to [facility].  New Advair prescription sent to requested pharmacy. Nothing further at this time.

## 2021-10-17 ENCOUNTER — Other Ambulatory Visit: Payer: Self-pay | Admitting: Nurse Practitioner

## 2021-10-17 ENCOUNTER — Other Ambulatory Visit (HOSPITAL_COMMUNITY): Payer: Self-pay | Admitting: Nurse Practitioner

## 2021-10-17 DIAGNOSIS — Z122 Encounter for screening for malignant neoplasm of respiratory organs: Secondary | ICD-10-CM

## 2021-10-17 DIAGNOSIS — F17200 Nicotine dependence, unspecified, uncomplicated: Secondary | ICD-10-CM

## 2021-10-24 ENCOUNTER — Other Ambulatory Visit (HOSPITAL_COMMUNITY): Payer: Self-pay | Admitting: Nurse Practitioner

## 2021-10-24 DIAGNOSIS — Z122 Encounter for screening for malignant neoplasm of respiratory organs: Secondary | ICD-10-CM

## 2021-10-24 DIAGNOSIS — F17210 Nicotine dependence, cigarettes, uncomplicated: Secondary | ICD-10-CM

## 2021-10-27 ENCOUNTER — Ambulatory Visit: Payer: Medicare Other | Admitting: Pulmonary Disease

## 2021-10-31 ENCOUNTER — Telehealth: Payer: Self-pay | Admitting: Pulmonary Disease

## 2021-11-01 NOTE — Telephone Encounter (Signed)
Spoke to patient.  ?She is questioning if she can do home sleep study vs in lab. She does not think that she will sleep well in lab. ? ?Dr. Loanne Drilling, please advise. thanks ?

## 2021-11-01 NOTE — Telephone Encounter (Signed)
Called patient and encouraged split night study as suspicion high for OSA requiring CPAP titration. ? ?I have confirmed with [facility] sleep lab that patient is scheduled for 11/03/21. No change to plan. Will close encounter. ?

## 2021-11-03 ENCOUNTER — Encounter (INDEPENDENT_AMBULATORY_CARE_PROVIDER_SITE_OTHER): Payer: Self-pay

## 2021-11-03 ENCOUNTER — Other Ambulatory Visit: Payer: Self-pay

## 2021-11-03 ENCOUNTER — Ambulatory Visit (HOSPITAL_BASED_OUTPATIENT_CLINIC_OR_DEPARTMENT_OTHER): Payer: Medicare Other | Attending: Pulmonary Disease | Admitting: Pulmonary Disease

## 2021-11-03 DIAGNOSIS — R0683 Snoring: Secondary | ICD-10-CM | POA: Insufficient documentation

## 2021-11-03 DIAGNOSIS — E669 Obesity, unspecified: Secondary | ICD-10-CM | POA: Insufficient documentation

## 2021-11-03 DIAGNOSIS — G4731 Primary central sleep apnea: Secondary | ICD-10-CM | POA: Insufficient documentation

## 2021-11-03 DIAGNOSIS — G4719 Other hypersomnia: Secondary | ICD-10-CM | POA: Diagnosis present

## 2021-11-03 DIAGNOSIS — Z6834 Body mass index (BMI) 34.0-34.9, adult: Secondary | ICD-10-CM | POA: Diagnosis not present

## 2021-11-03 DIAGNOSIS — R5383 Other fatigue: Secondary | ICD-10-CM | POA: Diagnosis not present

## 2021-11-03 DIAGNOSIS — G4733 Obstructive sleep apnea (adult) (pediatric): Secondary | ICD-10-CM | POA: Diagnosis not present

## 2021-11-03 DIAGNOSIS — R519 Headache, unspecified: Secondary | ICD-10-CM | POA: Insufficient documentation

## 2021-11-03 DIAGNOSIS — E119 Type 2 diabetes mellitus without complications: Secondary | ICD-10-CM | POA: Insufficient documentation

## 2021-11-09 ENCOUNTER — Telehealth: Payer: Self-pay | Admitting: Pulmonary Disease

## 2021-11-09 DIAGNOSIS — R0683 Snoring: Secondary | ICD-10-CM

## 2021-11-09 DIAGNOSIS — G4719 Other hypersomnia: Secondary | ICD-10-CM

## 2021-11-09 NOTE — Procedures (Signed)
? ? ? ?  Patient Name: Christina Whitehead, Christina Whitehead ?Study Date: 11/03/2021 ?Gender: Female ?D.O.B: [birth year] ?Age (years): 89 ?Referring Provider: Margaretha Seeds ?Height (inches): 66 ?Interpreting Physician: Chesley Mires MD, ABSM ?Weight (lbs): 212 ?RPSGT: Laren Everts ?BMI: 34 ?MRN: [id number] ?Neck Size: 16.00 ? ?CLINICAL INFORMATION ?Sleep Study Type: Split Night CPAP ? ?Indication for sleep study: Diabetes, Excessive Daytime Sleepiness, Fatigue, Morning Headaches, Obesity, Re-Evaluation, Snoring ? ?Epworth Sleepiness Score: 14 ? ?SLEEP STUDY TECHNIQUE ?As per the AASM Manual for the Scoring of Sleep and Associated Events v2.3 (April 2016) with a hypopnea requiring 4% desaturations. ? ?The channels recorded and monitored were frontal, central and occipital EEG, electrooculogram (EOG), submentalis EMG (chin), nasal and oral airflow, thoracic and abdominal wall motion, anterior tibialis EMG, snore microphone, electrocardiogram, and pulse oximetry. Continuous positive airway pressure (CPAP) was initiated when the patient met split night criteria and was titrated according to treat sleep-disordered breathing. ? ?MEDICATIONS ?Medications self-administered by patient taken the night of the study : N/A ? ?RESPIRATORY PARAMETERS ?Diagnostic ? ?Total AHI (/hr): 26.7 RDI (/hr): 30.5 OA Index (/hr): - CA Index (/hr): 0.0 ?REM AHI (/hr): N/A NREM AHI (/hr): 26.7 Supine AHI (/hr): 75.4 Non-supine AHI (/hr): 19 ?Min O2 Sat (%): 85.0 Mean O2 (%): 90.8 Time below 88% (min): 3  ? ?Titration ? ?Optimal Pressure (cm): 12 AHI at Optimal Pressure (/hr): 4.5 Min O2 at Optimal Pressure (%): 82.0 ?Supine % at Optimal (%): 0 Sleep % at Optimal (%): 89  ? ?SLEEP ARCHITECTURE ?The recording time for the entire night was 384.5 minutes. ? ?During a baseline period of 132.2 minutes, the patient slept for 128.0 minutes in REM and nonREM, yielding a sleep efficiency of 96.9%%. Sleep onset after lights out was 0.7 minutes with a REM latency of N/A  minutes. The patient spent 6.6%% of the night in stage N1 sleep, 93.4%% in stage N2 sleep, 0.0%% in stage N3 and 0% in REM. ? ?During the titration period of 244.4 minutes, the patient slept for 167.5 minutes in REM and nonREM, yielding a sleep efficiency of 68.5%%. Sleep onset after CPAP initiation was 4.2 minutes with a REM latency of 154.0 minutes. The patient spent 8.4%% of the night in stage N1 sleep, 67.8%% in stage N2 sleep, 0.0%% in stage N3 and 23.9% in REM. ? ?CARDIAC DATA ?The 2 lead EKG demonstrated sinus rhythm. The mean heart rate was 100.0 beats per minute. Other EKG findings include: None. ? ?LEG MOVEMENT DATA ?The total Periodic Limb Movements of Sleep (PLMS) were 0. The PLMS index was 0.0 . ? ?IMPRESSIONS ?- Moderate obstructive sleep apnea with an AHI of 26.7 and SpO2 low of 85%. ?- She did best with CPAP 12 cm H2O. ?- She did not require the use of supplemental oxygen during this study. ? ?DIAGNOSIS ?- Obstructive Sleep Apnea (G47.33) ? ?RECOMMENDATIONS ?- Trial of CPAP therapy on 12 cm H2O with a Medium size Fisher[employer]Paykel Full Face Vitera mask and heated humidification. ?- Avoid alcohol, sedatives and other CNS depressants that may worsen sleep apnea and disrupt normal sleep architecture. ?- Sleep hygiene should be reviewed to assess factors that may improve sleep quality. ?- Weight management and regular exercise should be initiated or continued. ? ?[Electronically signed] 11/09/2021 08:11 AM ? ?Chesley Mires MD, ABSM ?Diplomate, Tax adviser of Sleep Medicine ?NPI: [id number] ? ?[facility] ?PH: [phone]   FX: [phone] ?ACCREDITED BY THE AMERICAN ACADEMY OF SLEEP MEDICINE ? ?

## 2021-11-09 NOTE — Telephone Encounter (Signed)
[facility] Telephone Encounter ? ?Reviewed sleep study from 11/03/21. AHI 26.7 and nadir SpO2 85%. CPAP 12 cm H20 recommended. ? ?Discussed with patient options for treatment including CPAP and weight management. She is agreeable to CPAP and will plan to enroll in weight watcher program. Not a candidate for Inspire device but will be if she is <200lbs. ? ?Plan ?--ORDER CPAP 12 cm H20 with a Medium size Fisher[employer]Paykel Full Face Vitera mask and heated humidification. ? ?Rodman Pickle, M.D. ?[facility] ?11/09/2021 4:55 PM  ? ?

## 2021-11-23 ENCOUNTER — Ambulatory Visit (INDEPENDENT_AMBULATORY_CARE_PROVIDER_SITE_OTHER): Payer: Medicare Other | Admitting: Pulmonary Disease

## 2021-11-23 ENCOUNTER — Encounter: Payer: Self-pay | Admitting: Pulmonary Disease

## 2021-11-23 VITALS — BP 110/70 | HR 72 | Ht 64.0 in | Wt 225.4 lb

## 2021-11-23 DIAGNOSIS — J4540 Moderate persistent asthma, uncomplicated: Secondary | ICD-10-CM | POA: Diagnosis not present

## 2021-11-23 DIAGNOSIS — G4733 Obstructive sleep apnea (adult) (pediatric): Secondary | ICD-10-CM

## 2021-11-23 DIAGNOSIS — F17210 Nicotine dependence, cigarettes, uncomplicated: Secondary | ICD-10-CM | POA: Diagnosis not present

## 2021-11-23 DIAGNOSIS — Z720 Tobacco use: Secondary | ICD-10-CM

## 2021-11-23 DIAGNOSIS — R053 Chronic cough: Secondary | ICD-10-CM | POA: Diagnosis not present

## 2021-11-23 LAB — PULMONARY FUNCTION TEST
DL/VA % pred: 107 %
DL/VA: 4.5 ml/min/mmHg/L
DLCO cor % pred: 85 %
DLCO cor: 16.92 ml/min/mmHg
DLCO unc % pred: 85 %
DLCO unc: 16.92 ml/min/mmHg
FEF 25-75 Post: 2.62 L/sec
FEF 25-75 Pre: 1.67 L/sec
FEF2575-%Change-Post: 56 %
FEF2575-%Pred-Post: 125 %
FEF2575-%Pred-Pre: 80 %
FEV1-%Change-Post: 29 %
FEV1-%Pred-Post: 67 %
FEV1-%Pred-Pre: 52 %
FEV1-Post: 1.61 L
FEV1-Pre: 1.25 L
FEV1FVC-%Change-Post: -10 %
FEV1FVC-%Pred-Pre: 118 %
FEV6-%Change-Post: 44 %
FEV6-%Pred-Post: 64 %
FEV6-%Pred-Pre: 44 %
FEV6-Post: 1.94 L
FEV6-Pre: 1.34 L
FEV6FVC-%Change-Post: -1 %
FEV6FVC-%Pred-Post: 102 %
FEV6FVC-%Pred-Pre: 103 %
FVC-%Change-Post: 43 %
FVC-%Pred-Post: 62 %
FVC-%Pred-Pre: 43 %
FVC-Post: 1.97 L
FVC-Pre: 1.37 L
Post FEV1/FVC ratio: 82 %
Post FEV6/FVC ratio: 98 %
Pre FEV1/FVC ratio: 91 %
Pre FEV6/FVC Ratio: 100 %
RV % pred: 98 %
RV: 2.08 L
TLC % pred: 83 %
TLC: 4.24 L

## 2021-11-23 MED ORDER — ALBUTEROL SULFATE HFA 108 (90 BASE) MCG/ACT IN AERS: 2.0000 | INHALATION_SPRAY | Freq: Four times a day (QID) | RESPIRATORY_TRACT | 6 refills | Status: DC | PRN

## 2021-11-23 MED ORDER — FLUTICASONE-SALMETEROL 250-50 MCG/ACT IN AEPB: 1.0000 | INHALATION_SPRAY | Freq: Two times a day (BID) | RESPIRATORY_TRACT | 5 refills | Status: DC

## 2021-11-23 NOTE — Progress Notes (Signed)
PFT done today. 

## 2021-11-23 NOTE — Patient Instructions (Addendum)
Moderate persistent asthma +/- COPD ?--CONTINUE Advair 250-50 mcg ONE puff TWICE a day ?--CONTINUE Albuterol AS NEEDED for shortness of breath or wheezing ? ?Moderate OSA ?--Counseled on sleep hygiene ?--Counseled on weight loss/maintenance of healthy weight ?--Counseled NOT to drive if/when sleepy ?--Advised patient to wear CPAP for at least 4 hours each night for greater than 70% of the time to avoid the machine being repossessed by insurance. ? ?Tobacco Abuse ?--Cut down on smoking ? ? ?Peri-operative Assessment of Pulmonary Risk for Non-Thoracic Surgery: ? ?For Ms. Cabler, risk of perioperative pulmonary complications is increased by: ? Age greater than 65 years ? Smoking ? Obstructive sleep apnea ?  ?ARISCAT Low risk for Pulmonary complications ?1.6% risk of in-hospital post-op pulmonary complications (composite including respiratory failure, respiratory infection, pleural effusion, atelectasis, pneumothorax, bronchospasm, aspiration pneumonitis) ? ?Respiratory complications generally occur in 1% of ASA Class I patients, 5% of ASA Class II and 10% of ASA Class III-IV patients These complications rarely result in mortality and include postoperative pneumonia, atelectasis, pulmonary embolism, ARDS and increased time requiring postoperative mechanical ventilation. ? ?Overall, I recommend proceeding with the surgery if the risk for respiratory complications are outweighed by the potential benefits. This will need to be discussed between the patient and surgeon. ? ?To reduce risks of respiratory complications, I recommend: ?--Smoking cessation 6-8 weeks if possible ?--Pre- and post-operative incentive spirometry performed frequently while awake ?--Inpatient use of currently prescribed positive-pressure for OSA (12 cm H20) whenever the patient is sleeping ?--Avoiding use of pancuronium during anesthesia. ? ?I have discussed the risk factors and recommendations above with the patient. ? ?Follow-up with me in 3  months ?

## 2021-11-23 NOTE — Progress Notes (Signed)
? ? ?Subjective:  ? ?PATIENT ID: Christina Whitehead GENDER: female DOB: August 23, 1955, MRN: [id number] ? ? ?HPI ? ?Chief Complaint  ?Patient presents with  ? Follow-up  ?  Pft results  ? ? ?Reason for Visit: Follow-up ? ?Christina Whitehead is a 66 year old female active smoker with HTN and DM2 who presents for follow-up ? ?Synopsis: ?She was planned for surgery for back stimulator. During anesthesia evaluation on 07/29/21, procedure was deferred due to nadir 91%. She reports she has difficulty taking deep breath. She has productive coughing with thick sputum and wheezing that occurs at night and when laying down. She will awaken 1-2 nights a week.  ? ?She reports excessive daytime sleepiness. She will fall asleep when left alone and when sitting and reading/watching TV. Husband reports she snores but no witnessed apnea. Had sleep study >5 years ago but was negative. Denies weight gain since then. ? ?11/23/21 ?Since our last visit she was diagnosed with moderate OSA. CPAP supplies ordered but not yet delivered. She was also started on Advair. She is taking it at least once a day with improved with shortness of breath and cough. No longer wheezing at night. She currently smokes 1.5 ppd. Has not tried her rescue inhaler. ? ?Social History: ?Active smoker. Started at 14. 60 pack-years. Currently smoking 1.5 ppd. 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 ? ?Past Medical History:  ?Diagnosis Date  ? Anxiety   ? Diabetes mellitus without complication (HCC)   ? Hypertension   ? Neuropathy   ?  ? ?Family History  ?Problem Relation Age of Onset  ? Stroke Mother   ? Heart disease Mother   ? Renal cancer Mother   ? Breast cancer Maternal Aunt   ?  ? ?Social History  ? ?Occupational History  ? Not on file  ?Tobacco Use  ? Smoking status: Every Day  ?  Packs/day: 1.50  ?  Years: 40.00  ?  Pack years: 60.00  ?  Types: Cigarettes  ? Smokeless tobacco: Never  ?Vaping Use  ? Vaping Use: Never used  ?Substance and Sexual Activity  ? Alcohol  use: No  ? Drug use: No  ? Sexual activity: Not on file  ? ? ?Allergies  ?Allergen Reactions  ? Lisinopril Rash and Swelling  ?  Diffuse rash. No throat swelling  ? Penicillins Hives  ?  Rash ?Has patient had a PCN reaction causing immediate rash, facial/tongue/throat swelling, SOB or lightheadedness with hypotension: YES ?Has patient had a PCN reaction causing severe rash involving mucus membranes or skin necrosis: NO ?Has patient had a PCN reaction that required hospitalization: NO ?Has patient had a PCN reaction occurring within the last 10 years: NO} ?If all of the above answers are "NO", then may proceed with Cephalosporin use. ?  ?  ? ?Outpatient Medications Prior to Visit  ?Medication Sig Dispense Refill  ? busPIRone (BUSPAR) 10 MG tablet Take 10 mg by mouth 2 (two) times daily.     ? calcium carbonate (OSCAL) 1500 (600 Ca) MG TABS tablet Take by mouth.    ? escitalopram (LEXAPRO) 20 MG tablet TAKE 1/2 TABLET BY MOUTH DAILY FOR 7 DAYS THEN TAKE ONE TABLET BY MOUTH DAILY    ? gabapentin (NEURONTIN) 300 MG capsule TAKE THREE CAPSULES BY MOUTH THREE TIMES A DAY    ? hydrochlorothiazide (HYDRODIURIL) 25 MG tablet Take 1 tablet (25 mg total) by mouth daily. 90 tablet 3  ? metFORMIN (GLUCOPHAGE) 1000 MG tablet  Take 1,000 mg by mouth 2 (two) times daily with a meal.    ? pregabalin (LYRICA) 300 MG capsule Take 1 capsule by mouth twice a day as directed by physician.    ? vitamin B-12 (CYANOCOBALAMIN) 100 MCG tablet     ? fluticasone-salmeterol (ADVAIR DISKUS) 250-50 MCG/ACT AEPB Inhale 1 puff into the lungs in the morning and at bedtime. 60 each 5  ? ?No facility-administered medications prior to visit.  ? ? ?Review of Systems  ?Constitutional:  Negative for chills, diaphoresis, fever, malaise/fatigue and weight loss.  ?HENT:  Negative for congestion.   ?Respiratory:  Positive for cough and shortness of breath. Negative for hemoptysis, sputum production and wheezing.   ?Cardiovascular:  Negative for chest pain,  palpitations and leg swelling.  ? ? ?Objective:  ? ?Vitals:  ? 11/23/21 1332  ?BP: 110/70  ?Pulse: 72  ?SpO2: 93%  ?Weight: 225 lb 6.4 oz (102.2 kg)  ?Height: 5\' 4"  (1.626 m)  ? ?SpO2: 93 % ?O2 Device: None (Room air) ? ?Physical Exam: ?General: Well-appearing, no acute distress ?HENT: [place], AT ?Eyes: EOMI, no scleral icterus ?Respiratory: Clear to auscultation bilaterally.  No crackles, wheezing or rales ?Cardiovascular: RRR, -M/R/G, no JVD ?Extremities:-Edema,-tenderness ?Neuro: AAO x4, CNII-XII grossly intact ?Psych: Normal mood, normal affect ? ?Data Reviewed: ? ?Imaging: ?CXR 06/03/12 - No acute cardiopulmonary diseae ? ?PFT: ?11/23/2021 ?FVC 1.97 (62%) FEV1 1.61 (67%) ratio 91 TLC 83% DLCO 85% ?Interpretation: Reduced FEV1 and FVC with significant bronchodilator response present.  No restrictive defect and normal gas exchange.  Study suggestive of asthma ? ?Sleep: ?Split night 11/03/21 - AHI 26.7 and nadir SpO2 85%. CPAP 12 cm H20 recommended. ? ?Labs: ?Absolute eos 03/18/21 - 200  ? ?CBC ?   ?Component Value Date/Time  ? WBC 6.9 06/04/2012 0443  ? RBC 4.85 06/04/2012 0443  ? HGB 15.5 (H) 06/04/2012 0443  ? HCT 45.6 06/04/2012 0443  ? PLT 236 06/04/2012 0443  ? MCV 94.0 06/04/2012 0443  ? MCH 32.0 06/04/2012 0443  ? MCHC 34.0 06/04/2012 0443  ? RDW 13.5 06/04/2012 0443  ? LYMPHSABS 2.4 06/03/2012 1040  ? MONOABS 0.4 06/03/2012 1040  ? EOSABS 0.2 06/03/2012 1040  ? BASOSABS 0.0 06/03/2012 1040  ? ?Hg 16.1. Polycythemia ? ?Assessment & Plan:  ? ?Discussion: ?66 year old female active smoker with OSA, HTN and DM2 who presents for follow-up. Reviewed PFTs consistent with asthma.  ? ?Moderate persistent asthma +/- COPD ?--CONTINUE Advair 250-50 mcg ONE puff TWICE a day ?--CONTINUE Albuterol AS NEEDED for shortness of breath or wheezing ? ?Moderate OSA ?The natural history, progression and prognosis of sleep apnea, treatment with PAP and alternative treatment strategies were discussed. The patient was also educated  regarding the long term cardiovascular benefits of treating sleep apnea, including improved blood pressure control, reduction in MI and stroke risk as well as other potential benefits of treatment, such as improved glycemic control, facilitation of weight loss, improved energy during the day and improved sleep quality. ?--Counseled on sleep hygiene ?--Counseled on weight loss/maintenance of healthy weight ?--Counseled NOT to drive if/when sleepy ?--When she does receive CPAP, advised patient to wear CPAP for at least 4 hours each night for greater than 70% of the time to avoid the machine being repossessed by insurance. ? ?Tobacco abuse ?Patient is an active smoker. Not ready to quit. Discussed patches, lozenges and gum when she is read. We discussed smoking cessation for 3 minutes. We discussed triggers and stressors and ways to deal  with them. We discussed barriers to continued smoking and benefits of smoking cessation. Provided patient with information cessation techniques and interventions including [place] quitline. ? ?Peri-operative Assessment of Pulmonary Risk for Non-Thoracic Surgery: ? ?For Ms. Ottley, risk of perioperative pulmonary complications is increased by: ? Age greater than 65 years ? Smoking ? Obstructive sleep apnea ?  ?ARISCAT Low risk for Pulmonary complications ?1.6% risk of in-hospital post-op pulmonary complications (composite including respiratory failure, respiratory infection, pleural effusion, atelectasis, pneumothorax, bronchospasm, aspiration pneumonitis) ? ?Respiratory complications generally occur in 1% of ASA Class I patients, 5% of ASA Class II and 10% of ASA Class III-IV patients These complications rarely result in mortality and include postoperative pneumonia, atelectasis, pulmonary embolism, ARDS and increased time requiring postoperative mechanical ventilation. ? ?Overall, I recommend proceeding with the surgery if the risk for respiratory complications are outweighed by the  potential benefits. This will need to be discussed between the patient and surgeon. ? ?To reduce risks of respiratory complications, I recommend: ?--Smoking cessation 6-8 weeks if possible ?--Pre- and post-operative

## 2021-12-06 ENCOUNTER — Encounter (HOSPITAL_COMMUNITY): Payer: Self-pay

## 2021-12-06 ENCOUNTER — Ambulatory Visit (HOSPITAL_COMMUNITY): Payer: Medicare Other

## 2022-02-01 ENCOUNTER — Encounter: Payer: Self-pay | Admitting: Pulmonary Disease

## 2022-02-01 ENCOUNTER — Ambulatory Visit (INDEPENDENT_AMBULATORY_CARE_PROVIDER_SITE_OTHER): Payer: Medicare Other | Admitting: Pulmonary Disease

## 2022-02-01 VITALS — BP 116/64 | HR 73 | Temp 97.6°F | Ht 64.0 in | Wt 227.8 lb

## 2022-02-01 DIAGNOSIS — J4540 Moderate persistent asthma, uncomplicated: Secondary | ICD-10-CM

## 2022-02-01 DIAGNOSIS — G4733 Obstructive sleep apnea (adult) (pediatric): Secondary | ICD-10-CM | POA: Diagnosis not present

## 2022-02-01 DIAGNOSIS — Z720 Tobacco use: Secondary | ICD-10-CM

## 2022-02-01 NOTE — Progress Notes (Signed)
Subjective:   PATIENT ID: Christina Whitehead GENDER: female DOB: 1956/07/22, MRN: [id number]   HPI  Chief Complaint  Patient presents with   Follow-up    Pt states she has been doing okay since last visit. States she has dry mouth from wearing the CPAP.    Reason for Visit: Follow-up  [name] is a 66 year old female active smoker with HTN and DM2 who presents for follow-up  Synopsis: She was planned for surgery for back stimulator. During anesthesia evaluation on 07/29/21, procedure was deferred due to nadir 91%. She reports she has difficulty taking deep breath. She has productive coughing with thick sputum and wheezing that occurs at night and when laying down. She will awaken 1-2 nights a week.   She reports excessive daytime sleepiness. She will fall asleep when left alone and when sitting and reading/watching TV. Husband reports she snores but no witnessed apnea. Had sleep study >5 years ago but was negative. Denies weight gain since then.  11/23/21 Since our last visit she was diagnosed with moderate OSA. CPAP supplies ordered but not yet delivered. She was also started on Advair. She is taking it at least once a day with improved with shortness of breath and cough. No longer wheezing at night. She currently smokes 1.5 ppd. Has not tried her rescue inhaler.  02/01/22 She has been compliant with her CPAP and reports improved sleep quality, breathing. Energy is unchanged and low. She has been on Advair once a day. Using an albuterol 1-2 times a day. Has some wheezing and cough with productive cough. Nasal drainage will worsen these symptoms. Still smoking 1.5 ppd.   Social History: Active smoker. Started at 14. 60 pack-years. Currently smoking 1.5 ppd. 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)   Past Medical History:  Diagnosis Date   Anxiety    Diabetes mellitus without complication (HCC)    Hypertension    Neuropathy      Family History  Problem Relation Age of  Onset   Stroke Mother    Heart disease Mother    Renal cancer Mother    Breast cancer Maternal Aunt      Social History   Occupational History   Not on file  Tobacco Use   Smoking status: Every Day    Packs/day: 3.00    Years: 50.00    Total pack years: 150.00    Types: Cigarettes    Start date: 43   Smokeless tobacco: Never   Tobacco comments:    Currently smoking 1-1.5ppd as of 02/01/22 ep  Vaping Use   Vaping Use: Never used  Substance and Sexual Activity   Alcohol use: No   Drug use: No   Sexual activity: Not on file    Allergies  Allergen Reactions   Lisinopril Rash and Swelling    Diffuse rash. No throat swelling   Penicillins Hives    Rash Has patient had a PCN reaction causing immediate rash, facial/tongue/throat swelling, SOB or lightheadedness with hypotension: YES Has patient had a PCN reaction causing severe rash involving mucus membranes or skin necrosis: NO Has patient had a PCN reaction that required hospitalization: NO Has patient had a PCN reaction occurring within the last 10 years: NO} If all of the above answers are "NO", then may proceed with Cephalosporin use.      Outpatient Medications Prior to Visit  Medication Sig Dispense Refill   albuterol (VENTOLIN HFA) 108 (90 Base) MCG/ACT inhaler Inhale 2  puffs into the lungs every 6 (six) hours as needed for wheezing or shortness of breath. 8 g 6   busPIRone (BUSPAR) 10 MG tablet Take 10 mg by mouth 2 (two) times daily.      calcium carbonate (OSCAL) 1500 (600 Ca) MG TABS tablet Take by mouth.     escitalopram (LEXAPRO) 20 MG tablet TAKE 1/2 TABLET BY MOUTH DAILY FOR 7 DAYS THEN TAKE ONE TABLET BY MOUTH DAILY     fluticasone-salmeterol (ADVAIR DISKUS) 250-50 MCG/ACT AEPB Inhale 1 puff into the lungs in the morning and at bedtime. 60 each 5   gabapentin (NEURONTIN) 300 MG capsule TAKE THREE CAPSULES BY MOUTH THREE TIMES A DAY     hydrochlorothiazide (HYDRODIURIL) 25 MG tablet Take 1 tablet (25 mg  total) by mouth daily. 90 tablet 3   metFORMIN (GLUCOPHAGE) 1000 MG tablet Take 1,000 mg by mouth 2 (two) times daily with a meal.     pregabalin (LYRICA) 300 MG capsule Take 1 capsule by mouth twice a day as directed by physician.     vitamin B-12 (CYANOCOBALAMIN) 100 MCG tablet      No facility-administered medications prior to visit.    Review of Systems  Constitutional:  Negative for chills, diaphoresis, fever, malaise/fatigue and weight loss.  HENT:  Positive for congestion.   Respiratory:  Positive for cough, shortness of breath and wheezing. Negative for hemoptysis and sputum production.   Cardiovascular:  Negative for chest pain, palpitations and leg swelling.     Objective:   Vitals:   02/01/22 0916  BP: 116/64  Pulse: 73  Temp: 97.6 F (36.4 C)  TempSrc: Oral  SpO2: 95%  Weight: 227 lb 12.8 oz (103.3 kg)  Height: 5\' 4"  (1.626 m)   SpO2: 95 % (RA) O2 Device: None (Room air)  Physical Exam: General: Well-appearing, no acute distress HENT: [place], AT Eyes: EOMI, no scleral icterus Respiratory: Clear to auscultation bilaterally.  No crackles, wheezing or rales Cardiovascular: RRR, -M/R/G, no JVD Extremities:-Edema,-tenderness Neuro: AAO x4, CNII-XII grossly intact Psych: Normal mood, normal affect  Data Reviewed:  Imaging: CXR 06/03/12 - No acute cardiopulmonary diseae  PFT: 11/23/2021 FVC 1.97 (62%) FEV1 1.61 (67%) ratio 91 TLC 83% DLCO 85% Interpretation: Reduced FEV1 and FVC with significant bronchodilator response present.  No restrictive defect and normal gas exchange.  Study suggestive of asthma  Sleep: Split night 11/03/21 - AHI 26.7 and nadir SpO2 85%. CPAP 12 cm H20 recommended.  Labs: Absolute eos 03/18/21 - 200   CBC    Component Value Date/Time   WBC 6.9 06/04/2012 0443   RBC 4.85 06/04/2012 0443   HGB 15.5 (H) 06/04/2012 0443   HCT 45.6 06/04/2012 0443   PLT 236 06/04/2012 0443   MCV 94.0 06/04/2012 0443   MCH 32.0 06/04/2012 0443   MCHC  34.0 06/04/2012 0443   RDW 13.5 06/04/2012 0443   LYMPHSABS 2.4 06/03/2012 1040   MONOABS 0.4 06/03/2012 1040   EOSABS 0.2 06/03/2012 1040   BASOSABS 0.0 06/03/2012 1040   Hg 16.1. Polycythemia  CPAP Compliance 12/02/21-01/30/22 Usage days 53/60 days (88%) >4 hours 39 days (65%) CPAP 12 cm H20 AHI 0.4   Assessment & Plan:   Discussion: 66 year old female active smoker with COPD-asthma overlap, OSA, HTN and DM2 who presents for follow-up. Symptomatic however only taking ICS/LABA daily. Using SABA daily.  Moderate persistent asthma +/- COPD - symptomatic, not in exacerbation --CONTINUE Advair 250-50 mcg ONE puff TWICE a day --CONTINUE Albuterol AS NEEDED  for shortness of breath or wheezing  Moderate OSA The natural history, progression and prognosis of sleep apnea, treatment with PAP and alternative treatment strategies were discussed. The patient was also educated regarding the long term cardiovascular benefits of treating sleep apnea, including improved blood pressure control, reduction in MI and stroke risk as well as other potential benefits of treatment, such as improved glycemic control, facilitation of weight loss, improved energy during the day and improved sleep quality. --Counseled on sleep hygiene --Counseled on weight loss/maintenance of healthy weight --Counseled NOT to drive if/when sleepy --Advised patient to wear CPAP for at least 4 hours each night for greater than 70% of the time to avoid the machine being repossessed by insurance.  Tobacco abuse Patient is an active smoker. Not ready to quit.  We discussed smoking cessation for 3 minutes. We discussed triggers and stressors and ways to deal with them. We discussed barriers to continued smoking and benefits of smoking cessation. Provided patient with information cessation techniques and interventions including [place] quitline.   Health Maintenance Immunization History  Administered Date(s) Administered   Moderna  Sars-Covid-2 Vaccination 10/29/2019, 11/26/2019, 07/12/2020   CT Lung Screen - Scheduled  No orders of the defined types were placed in this encounter.  No orders of the defined types were placed in this encounter.   Return in about 6 months (around 08/03/2022).  I have spent a total time of 30-minutes on the day of the appointment including chart review, data review, collecting history, coordinating care and discussing medical diagnosis and plan with the patient/family. Past medical history, allergies, medications were reviewed. Pertinent imaging, labs and tests included in this note have been reviewed and interpreted independently by me.  Christina Whitehead Mechele CollinJane Delvon Chipps, MD [facility] 02/01/2022 9:30 AM  Office Number [phone]

## 2022-02-01 NOTE — Patient Instructions (Signed)
Moderate persistent asthma +/- COPD --CONTINUE Advair 250-50 mcg ONE puff TWICE a day --CONTINUE Albuterol AS NEEDED for shortness of breath or wheezing  Moderate OSA The natural history, progression and prognosis of sleep apnea, treatment with PAP and alternative treatment strategies were discussed. The patient was also educated regarding the long term cardiovascular benefits of treating sleep apnea, including improved blood pressure control, reduction in MI and stroke risk as well as other potential benefits of treatment, such as improved glycemic control, facilitation of weight loss, improved energy during the day and improved sleep quality. --Counseled on sleep hygiene --Counseled on weight loss/maintenance of healthy weight --Counseled NOT to drive if/when sleepy --Advised patient to wear CPAP for at least 4 hours each night for greater than 70% of the time to avoid the machine being repossessed by insurance.  Tobacco abuse Patient is an active smoker. Not ready to quit.  We discussed smoking cessation for 3 minutes. We discussed triggers and stressors and ways to deal with them. We discussed barriers to continued smoking and benefits of smoking cessation. Provided patient with information cessation techniques and interventions including [place] quitline.  Follow-up with me in 6 month (December)

## 2022-03-01 ENCOUNTER — Ambulatory Visit: Payer: Medicare Other | Admitting: Pulmonary Disease

## 2022-04-26 ENCOUNTER — Other Ambulatory Visit: Payer: Self-pay | Admitting: *Deleted

## 2022-04-26 DIAGNOSIS — F17210 Nicotine dependence, cigarettes, uncomplicated: Secondary | ICD-10-CM

## 2022-04-26 DIAGNOSIS — Z122 Encounter for screening for malignant neoplasm of respiratory organs: Secondary | ICD-10-CM

## 2022-04-26 DIAGNOSIS — Z87891 Personal history of nicotine dependence: Secondary | ICD-10-CM

## 2022-05-08 ENCOUNTER — Other Ambulatory Visit (HOSPITAL_COMMUNITY): Payer: Self-pay | Admitting: Nurse Practitioner

## 2022-05-08 DIAGNOSIS — Z1231 Encounter for screening mammogram for malignant neoplasm of breast: Secondary | ICD-10-CM

## 2022-05-26 ENCOUNTER — Ambulatory Visit (HOSPITAL_COMMUNITY): Payer: Medicare Other

## 2022-05-31 ENCOUNTER — Ambulatory Visit: Payer: Medicare Other | Attending: Cardiovascular Disease | Admitting: Cardiovascular Disease

## 2022-05-31 ENCOUNTER — Encounter: Payer: Self-pay | Admitting: Cardiovascular Disease

## 2022-05-31 VITALS — BP 121/75 | HR 82 | Ht 64.0 in | Wt 226.2 lb

## 2022-05-31 DIAGNOSIS — R072 Precordial pain: Secondary | ICD-10-CM | POA: Diagnosis not present

## 2022-05-31 DIAGNOSIS — G4733 Obstructive sleep apnea (adult) (pediatric): Secondary | ICD-10-CM | POA: Insufficient documentation

## 2022-05-31 DIAGNOSIS — J4540 Moderate persistent asthma, uncomplicated: Secondary | ICD-10-CM

## 2022-05-31 DIAGNOSIS — E1143 Type 2 diabetes mellitus with diabetic autonomic (poly)neuropathy: Secondary | ICD-10-CM | POA: Insufficient documentation

## 2022-05-31 DIAGNOSIS — E790 Hyperuricemia without signs of inflammatory arthritis and tophaceous disease: Secondary | ICD-10-CM | POA: Diagnosis present

## 2022-05-31 DIAGNOSIS — R9431 Abnormal electrocardiogram [ECG] [EKG]: Secondary | ICD-10-CM | POA: Insufficient documentation

## 2022-05-31 DIAGNOSIS — E7800 Pure hypercholesterolemia, unspecified: Secondary | ICD-10-CM | POA: Diagnosis not present

## 2022-05-31 MED ORDER — METOPROLOL TARTRATE 100 MG PO TABS: ORAL_TABLET | ORAL | 0 refills | Status: DC

## 2022-05-31 NOTE — Patient Instructions (Signed)
Medication Instructions:  No changes *If you need a refill on your cardiac medications before your next appointment, please call your pharmacy*   Lab Work: Your provider would like for you to have the following labs today: BMET  If you have labs (blood work) drawn today and your tests are completely normal, you will receive your results only by: [place] (if you have MyChart) OR A paper copy in the mail If you have any lab test that is abnormal or we need to change your treatment, we will call you to review the results.   Follow-Up: At [facility], you and your health needs are our priority.  As part of our continuing mission to provide you with exceptional heart care, we have created designated Provider Care Teams.  These Care Teams include your primary Cardiologist (physician) and Advanced Practice Providers (APPs -  Physician Assistants and Nurse Practitioners) who all work together to provide you with the care you need, when you need it.  We recommend signing up for the patient portal called "MyChart".  Sign up information is provided on this After Visit Summary.  MyChart is used to connect with patients for Virtual Visits (Telemedicine).  Patients are able to view lab/test results, encounter notes, upcoming appointments, etc.  Non-urgent messages can be sent to your provider as well.   To learn more about what you can do with MyChart, go to [email or web address].    Your next appointment:   Follow up as needed pending the results.  Other Instructions   Your cardiac CT will be scheduled at one of the below locations:   [facility] [address] [place], [place] [address] (336) [place] [address] [place], [place] [address] [phone]  [facility] [place], [place] [address] [phone]  If scheduled at [facility],  please arrive at the [facility] and Children's Entrance (Entrance C2) of [facility] 30 minutes prior to test start time. You can use the FREE valet parking offered at entrance C (encouraged to control the heart rate for the test)  Proceed to the [facility] Radiology Department (first floor) to check-in and test prep.  All radiology patients and guests should use entrance C2 at [facility], accessed from [facility], even though the hospital's physical address listed is [address].    If scheduled at [facility] or [facility], please arrive 15 mins early for check-in and test prep.   Please follow these instructions carefully (unless otherwise directed):  We will administer nitroglycerin during this exam.   On the Night Before the Test: Be sure to Drink plenty of water. Do not consume any caffeinated/decaffeinated beverages or chocolate 12 hours prior to your test. Do not take any antihistamines 12 hours prior to your test. If the patient has contrast allergy:  On the Day of the Test: Drink plenty of water until 1 hour prior to the test. Do not eat any food 1 hour prior to test. You may take your regular medications prior to the test.  Take metoprolol (Lopressor) two hours prior to test. HOLD Hydrochlorothiazide morning of the test. FEMALES- please wear underwire-free bra if available, avoid dresses & tight clothing Hold the metformin that morning       After the Test: Drink plenty of water. After receiving IV contrast, you may experience a  mild flushed feeling. This is normal. On occasion, you may experience a mild rash up to 24 hours after the test. This is not dangerous. If this occurs, you can take Benadryl 25 mg and increase your fluid intake. If you experience trouble breathing, this can be serious. If it is severe call 911 IMMEDIATELY. If it is mild, please call our office. If you take any  of these medications: Glipizide/Metformin, Avandament, Glucavance, please do not take 48 hours after completing test unless otherwise instructed.  We will call to schedule your test 2-4 weeks out understanding that some insurance companies will need an authorization prior to the service being performed.   For non-scheduling related questions, please contact the cardiac imaging nurse navigator should you have any questions/concerns: [place], Cardiac Imaging Nurse Navigator Larey Brick, Cardiac Imaging Nurse Navigator [facility] Direct Office Dial: [phone]   For scheduling needs, including cancellations and rescheduling, please call [place], [phone].

## 2022-05-31 NOTE — Progress Notes (Signed)
Cardiology Office Note:    Date:  06/01/2022   ID:  Christina Whitehead, DOB 08/13/1956, MRN [id number]  PCP:  Elizabeth Palau, FNP   [facility] Providers Cardiologist:  None Cardiology APP:  Collier Bullock     Referring MD: Elizabeth Palau, FNP   Chief Complaint  Patient presents with   Consult  Christina Whitehead is a 66 y.o. female who is being seen today for the evaluation of abnormal ECG at the request of Elizabeth Palau, FNP.   History of Present Illness:    Christina Whitehead is a 66 y.o. female with a hx of type 2 diabetes mellitus (controlled on metformin monotherapy, complicated by neuropathy), possible treated hypertension, heavy smoking (average of 2 packs a day for about 50 years), reactive airway disease, obstructive sleep apnea (started CPAP for the last 3-4 months), referred in consultation for an abnormal electrocardiogram and episodes of chest pain.  Last week she had an episode of chest discomfort that felt like a tight sensation raising from the upper retrosternal area to her jaw and her teeth.  It lasted for about 5 to 10 minutes and resolve spontaneously.  It was not terribly severe but caught her attention.  She does not have similar symptoms with activity, but is limited by severe back pain.  She has chronic shortness of breath and wheezing attributed to bronchospasm and years of heavy smoking.  She started using CPAP about 3 to 4 months ago and has improvement in daytime hypersomnolence.  She denies palpitations, dizziness, syncope, lower extremity edema, orthopnea or PND.  She has never had focal neurological symptoms to suggest stroke or TIA.  She reports that she was prescribed hydrochlorothiazide "to protect her kidneys" and she is not had hypertension.  She was initially tried on lisinopril which caused a diffuse rash and then was switched to hydrochlorothiazide.  In 2013 she had an episode of near syncope.  Reports that time she had a  treadmill stress test and echocardiogram which were reportedly normal.  She was diagnosed with "panic attacks".  She has a back stimulator for chronic back pain.  PFTs performed earlier this year showed an FEV1 67% of predicted at 1.6 L but with significant bronchodilator response.  More consistent with asthma than COPD.  Sleep study showed moderate OSA with an AHI of 26.7 and nadir saturation of 85%.  She is polycythemic.  She has a strong family history for early onset vascular complications.  Her maternal grandfather had a myocardial infarction at age 38 as did her maternal uncle.  Her mother never had heart problems but did have hypertension starting at age 15 and multiple strokes after that.  Past Medical History:  Diagnosis Date   Anxiety    Diabetes mellitus without complication (HCC)    Hypertension    Neuropathy     Past Surgical History:  Procedure Laterality Date   ABDOMINAL HYSTERECTOMY     CESAREAN SECTION     x2    Current Medications: Current Meds  Medication Sig   albuterol (VENTOLIN HFA) 108 (90 Base) MCG/ACT inhaler Inhale 2 puffs into the lungs every 6 (six) hours as needed for wheezing or shortness of breath.   buPROPion (WELLBUTRIN XL) 300 MG 24 hr tablet Take 1 tablet by mouth daily.   calcium carbonate (OSCAL) 1500 (600 Ca) MG TABS tablet Take by mouth.   escitalopram (LEXAPRO) 20 MG tablet TAKE 1/2 TABLET BY MOUTH DAILY FOR 7 DAYS THEN TAKE ONE  TABLET BY MOUTH DAILY   fluticasone-salmeterol (ADVAIR DISKUS) 250-50 MCG/ACT AEPB Inhale 1 puff into the lungs in the morning and at bedtime.   gabapentin (NEURONTIN) 300 MG capsule TAKE THREE CAPSULES BY MOUTH THREE TIMES A DAY   hydrochlorothiazide (HYDRODIURIL) 25 MG tablet Take 1 tablet (25 mg total) by mouth daily.   metFORMIN (GLUCOPHAGE) 1000 MG tablet Take 1,000 mg by mouth 2 (two) times daily with a meal.   metoprolol tartrate (LOPRESSOR) 100 MG tablet Take one tablet two hours prior to the test   pregabalin  (LYRICA) 300 MG capsule Take 1 capsule by mouth twice a day as directed by physician.   vitamin B-12 (CYANOCOBALAMIN) 100 MCG tablet      Allergies:   Lisinopril and Penicillins   Social History   Socioeconomic History   Marital status: Married    Spouse name: Not on file   Number of children: Not on file   Years of education: Not on file   Highest education level: Not on file  Occupational History   Not on file  Tobacco Use   Smoking status: Every Day    Packs/day: 3.00    Years: 50.00    Total pack years: 150.00    Types: Cigarettes    Start date: 291971   Smokeless tobacco: Never   Tobacco comments:    Currently smoking 1-1.5ppd as of 02/01/22 ep  Vaping Use   Vaping Use: Never used  Substance and Sexual Activity   Alcohol use: No   Drug use: No   Sexual activity: Not on file  Other Topics Concern   Not on file  Social History Narrative   Not on file   Social Determinants of Health   Financial Resource Strain: Not on file  Food Insecurity: Not on file  Transportation Needs: Not on file  Physical Activity: Not on file  Stress: Not on file  Social Connections: Not on file     Family History: The patient's family history includes Breast cancer in her maternal aunt; Heart disease in her mother; Renal cancer in her mother; Stroke in her mother.  ROS:   Please see the history of present illness.     All other systems reviewed and are negative.  EKGs/Labs/Other Studies Reviewed:    The following studies were reviewed today: Reviewed the couple of EKGs performed in the outside offices which shows sinus rhythm, left axis deviation with suspicion for old inferior infarction and old anterior infarction due to poor R wave progression and diminutive R waves in the inferior leads.  On close review there do appear to be small R waves in all these leads and I suspect that the abnormalities are due to "almost" left anterior fascicular block pattern, rather than previous  infarction.  EKG:  EKG is ordered today.  The ekg ordered today demonstrates normal sinus rhythm, left axis deviation almost meeting criteria for left anterior fascicular block, no ischemic repolarization abnormalities, QTc 446 ms  Recent Labs: 05/31/2022: BUN 19; Creatinine, Ser 0.89; Potassium 4.2; Sodium 141  05/10/2022 Hemoglobin A1c 6.5%, creatinine 0.73, potassium 4.1, normal liver function tests, uric acid 7.5 Hemoglobin 15.1 Recent Lipid Panel No results found for: "CHOL", "TRIG", "HDL", "CHOLHDL", "VLDL", "LDLCALC", "LDLDIRECT" 05/10/2022 Cholesterol 181, triglycerides 132, HDL 61, calculated LDL 97  Risk Assessment/Calculations:                Physical Exam:    VS:  BP 121/75 (BP Location: Left Arm, Patient Position: Sitting, Cuff Size:  Large)   Pulse 82   Ht 5\' 4"  (1.626 m)   Wt 226 lb 3.2 oz (102.6 kg)   SpO2 92%   BMI 38.83 kg/m     Wt Readings from Last 3 Encounters:  05/31/22 226 lb 3.2 oz (102.6 kg)  02/01/22 227 lb 12.8 oz (103.3 kg)  11/23/21 225 lb 6.4 oz (102.2 kg)     GEN: Severely obese, well nourished, well developed in no acute distress HEENT: Normal NECK: No JVD; No carotid bruits LYMPHATICS: No lymphadenopathy CARDIAC: RRR, no murmurs, rubs, gallops RESPIRATORY:  Clear to auscultation without rales, wheezing or rhonchi  ABDOMEN: Soft, non-tender, non-distended MUSCULOSKELETAL:  No edema; No deformity  SKIN: Warm and dry NEUROLOGIC:  Alert and oriented x 3 PSYCHIATRIC:  Normal affect   ASSESSMENT:    1. Precordial pain   2. Nonspecific abnormal electrocardiogram (ECG) (EKG)   3. Hypercholesterolemia   4. Moderate persistent asthma without complication   5. Controlled type 2 diabetes mellitus with diabetic autonomic neuropathy, without long-term current use of insulin (HCC)   6. Hyperuricemia   7. OSA (obstructive sleep apnea)   8. Severe obesity (BMI 35.0-39.9) with comorbidity (HCC)    PLAN:    In order of problems listed  above:  Chest pain: The episode that she describes last week is concerning for angina pectoris.  Functional status is hard to assess due to sedentary lifestyle from back pain.  She has numerous coronary risk factors including heavy history of smoking and ongoing tobacco use, type 2 diabetes mellitus, family history and possible hypertension.  Recommend we proceed with coronary CT angiography since this will also give 07-21-1990 information about overall plaque burden and prognosis. Abnormal ECG: Cannot entirely exclude a previous myocardial infarction, but I think her ECG changes are related to conduction abnormalities.  If significant CAD is identified she may need to undergo cardiac catheterization and echocardiography will clarify if she has had previous infarction. Hypercholesterolemia: Current LDL cholesterol level is acceptable for patient with diabetes without vascular disease, but if we find that she has heavy plaque burden or significant coronary stenoses we will need to intensify lipid-lowering therapy to target LDL less than 70. Reactive airway disease/asthma: Avoid nonselective beta-blockers.  Prefer metoprolol or bisoprolol. DM w neuropathy: Glucose levels well controlled on metformin monotherapy.  Hold metformin and hydrochlorothiazide prior to her coronary CT angiogram.  Normal kidney function at baseline. HTN: Unclear whether this is an accurate diagnosis.  Discussed the fact that hydrochlorothiazide does not really protect renal function in any way.  Also may be responsible for her mildly elevated uric acid levels.  May be able to discontinue this medication. OSA: Reports compliance with recently prescribed CPAP and some improvement in daytime hypersomnolence.  Followed by Dr. [place]. Severe obesity: With multiple comorbid complications including diabetes mellitus, dyslipidemia, obstructive sleep apnea.           Medication Adjustments/Labs and Tests Ordered: Current medicines are reviewed  at length with the patient today.  Concerns regarding medicines are outlined above.  Orders Placed This Encounter  Procedures   CT CORONARY MORPH W/CTA COR W/SCORE W/CA W/CM &/OR WO/CM   Basic metabolic panel   EKG 12-Lead   Meds ordered this encounter  Medications   metoprolol tartrate (LOPRESSOR) 100 MG tablet    Sig: Take one tablet two hours prior to the test    Dispense:  1 tablet    Refill:  0    Patient Instructions  Medication Instructions:  No  changes *If you need a refill on your cardiac medications before your next appointment, please call your pharmacy*   Lab Work: Your provider would like for you to have the following labs today: BMET  If you have labs (blood work) drawn today and your tests are completely normal, you will receive your results only by: [place] (if you have MyChart) OR A paper copy in the mail If you have any lab test that is abnormal or we need to change your treatment, we will call you to review the results.   Follow-Up: At [facility], you and your health needs are our priority.  As part of our continuing mission to provide you with exceptional heart care, we have created designated Provider Care Teams.  These Care Teams include your primary Cardiologist (physician) and Advanced Practice Providers (APPs -  Physician Assistants and Nurse Practitioners) who all work together to provide you with the care you need, when you need it.  We recommend signing up for the patient portal called "MyChart".  Sign up information is provided on this After Visit Summary.  MyChart is used to connect with patients for Virtual Visits (Telemedicine).  Patients are able to view lab/test results, encounter notes, upcoming appointments, etc.  Non-urgent messages can be sent to your provider as well.   To learn more about what you can do with MyChart, go to [email or web address].    Your next appointment:   Follow up as needed pending the results.   Other Instructions   Your cardiac CT will be scheduled at one of the below locations:   [facility] [address] [place], [place] [address] [phone] [address] [place], [place] [address] [phone]  [facility] [place], [place] [address] [phone]  If scheduled at [facility], please arrive at the [facility] and Children's Entrance (Entrance C2) of [facility] 30 minutes prior to test start time. You can use the FREE valet parking offered at entrance C (encouraged to control the heart rate for the test)  Proceed to the [facility] Radiology Department (first floor) to check-in and test prep.  All radiology patients and guests should use entrance C2 at [facility], accessed from [facility], even though the hospital's physical address listed is [address]    If scheduled at [facility] or [facility], please arrive 15 mins early for check-in and test prep.   Please follow these instructions carefully (unless otherwise directed):  We will administer nitroglycerin during this exam.   On the Night Before the Test: Be sure to Drink plenty of water. Do not consume any caffeinated/decaffeinated beverages or chocolate 12 hours prior to your test. Do not take any antihistamines 12 hours prior to your test. If the patient has contrast allergy:  On the Day of the Test: Drink plenty of water until 1 hour prior to the test. Do not eat any food 1 hour prior to test. You may take your regular medications prior to the test.  Take metoprolol (Lopressor) two hours prior to test. HOLD Hydrochlorothiazide morning of the test. FEMALES- please wear underwire-free bra if available, avoid dresses & tight clothing Hold the metformin that morning       After the  Test: Drink plenty of water. After receiving IV contrast, you may experience a mild flushed feeling.  This is normal. On occasion, you may experience a mild rash up to 24 hours after the test. This is not dangerous. If this occurs, you can take Benadryl 25 mg and increase your fluid intake. If you experience trouble breathing, this can be serious. If it is severe call 911 IMMEDIATELY. If it is mild, please call our office. If you take any of these medications: Glipizide/Metformin, Avandament, Glucavance, please do not take 48 hours after completing test unless otherwise instructed.  We will call to schedule your test 2-4 weeks out understanding that some insurance companies will need an authorization prior to the service being performed.   For non-scheduling related questions, please contact the cardiac imaging nurse navigator should you have any questions/concerns: Rockwell Alexandria, Cardiac Imaging Nurse Navigator Larey Brick, Cardiac Imaging Nurse Navigator [facility] Direct Office Dial: [phone]   For scheduling needs, including cancellations and rescheduling, please call [place], [phone].       Signed, Thurmon Fair, MD  06/01/2022 9:15 AM    [facility]

## 2022-06-01 ENCOUNTER — Encounter: Payer: Self-pay | Admitting: *Deleted

## 2022-06-01 DIAGNOSIS — E7800 Pure hypercholesterolemia, unspecified: Secondary | ICD-10-CM | POA: Insufficient documentation

## 2022-06-01 LAB — BASIC METABOLIC PANEL
BUN/Creatinine Ratio: 21 (ref 12–28)
BUN: 19 mg/dL (ref 8–27)
CO2: 25 mmol/L (ref 20–29)
Calcium: 9.3 mg/dL (ref 8.7–10.3)
Chloride: 100 mmol/L (ref 96–106)
Creatinine, Ser: 0.89 mg/dL (ref 0.57–1.00)
Glucose: 127 mg/dL — ABNORMAL HIGH (ref 70–99)
Potassium: 4.2 mmol/L (ref 3.5–5.2)
Sodium: 141 mmol/L (ref 134–144)
eGFR: 71 mL/min/{1.73_m2} (ref >59)

## 2022-06-06 ENCOUNTER — Ambulatory Visit (INDEPENDENT_AMBULATORY_CARE_PROVIDER_SITE_OTHER): Payer: Medicare Other | Admitting: Acute Care

## 2022-06-06 ENCOUNTER — Encounter: Payer: Self-pay | Admitting: Acute Care

## 2022-06-06 DIAGNOSIS — F17210 Nicotine dependence, cigarettes, uncomplicated: Secondary | ICD-10-CM | POA: Diagnosis not present

## 2022-06-06 DIAGNOSIS — F17200 Nicotine dependence, unspecified, uncomplicated: Secondary | ICD-10-CM | POA: Diagnosis not present

## 2022-06-06 NOTE — Progress Notes (Signed)
Virtual Visit via Telephone Note  I connected with Christina Whitehead on 06/06/22 at  3:30 PM EDT by telephone and verified that I am speaking with the correct person using two identifiers.  Location: Patient: At home  Provider: [address] [place], [place], [address]    I discussed the limitations, risks, security and privacy concerns of performing an evaluation and management service by telephone and the availability of in person appointments. I also discussed with the patient that there may be a patient responsible charge related to this service. The patient expressed understanding and agreed to proceed.   Shared Decision Making Visit Lung Cancer Screening Program [phone])   Eligibility: Age 65 y.o. Pack Years Smoking History Calculation 75 pack year smoking history (# packs/per year x # years smoked) Recent History of coughing up blood  no Unexplained weight loss? no ( >Than 15 pounds within the last 6 months ) Prior History Lung / other cancer no (Diagnosis within the last 5 years already requiring surveillance chest CT Scans). Smoking Status Current Smoker Former Smokers: Years since quit:  NA  Quit Date:  NA  Visit Components: Discussion included one or more decision making aids. yes Discussion included risk/benefits of screening. yes Discussion included potential follow up diagnostic testing for abnormal scans. yes Discussion included meaning and risk of over diagnosis. yes Discussion included meaning and risk of False Positives. yes Discussion included meaning of total radiation exposure. yes  Counseling Included: Importance of adherence to annual lung cancer LDCT screening. yes Impact of comorbidities on ability to participate in the program. yes Ability and willingness to under diagnostic treatment. yes  Smoking Cessation Counseling: Current Smokers:  Discussed importance of smoking cessation. yes Information about tobacco cessation classes and  interventions provided to patient. yes Patient provided with "ticket" for LDCT Scan. yes Symptomatic Patient. no  Counseling NA Diagnosis Code: Tobacco Use Z72.0 Asymptomatic Patient yes  Counseling (Intermediate counseling: > three minutes counseling) U9323 Former Smokers:  Discussed the importance of maintaining cigarette abstinence. yes Diagnosis Code: Personal History of Nicotine Dependence. F57.322 Information about tobacco cessation classes and interventions provided to patient. Yes Patient provided with "ticket" for LDCT Scan. yes Written Order for Lung Cancer Screening with LDCT placed in Epic. Yes (CT Chest Lung Cancer Screening Low Dose W/O CM) [id number] Z12.2-Screening of respiratory organs Z87.891-Personal history of nicotine dependence  I have spent 25 minutes of face to face/ virtual visit   time with  Christina Whitehead discussing the risks and benefits of lung cancer screening. We viewed / discussed a power point together that explained in detail the above noted topics. We paused at intervals to allow for questions to be asked and answered to ensure understanding.We discussed that the single most powerful action that she can take to decrease her risk of developing lung cancer is to quit smoking. We discussed whether or not she is ready to commit to setting a quit date. We discussed options for tools to aid in quitting smoking including nicotine replacement therapy, non-nicotine medications, support groups, Quit Smart classes, and behavior modification. We discussed that often times setting smaller, more achievable goals, such as eliminating 1 cigarette a day for a week and then 2 cigarettes a day for a week can be helpful in slowly decreasing the number of cigarettes smoked. This allows for a sense of accomplishment as well as providing a clinical benefit. I provided  her  with smoking cessation  information  with contact information for community resources, classes,  free nicotine replacement  therapy, and access to mobile apps, text messaging, and on-line smoking cessation help. I have also provided  her  the office contact information in the event she needs to contact me, or the screening staff. We discussed the time and location of the scan, and that either Doroteo Glassman RN, Joella Prince, RN  or I will call / send a letter with the results within 24-72 hours of receiving them. The patient verbalized understanding of all of  the above and had no further questions upon leaving the office. They have my contact information in the event they have any further questions.  I spent 3 minutes counseling on smoking cessation and the health risks of continued tobacco abuse.  I explained to the patient that there has been a high incidence of coronary artery disease noted on these exams. I explained that this is a non-gated exam therefore degree or severity cannot be determined. This patient is not on statin therapy. I have asked the patient to follow-up with their PCP regarding any incidental finding of coronary artery disease and management with diet or medication as their PCP  feels is clinically indicated. The patient verbalized understanding of the above and had no further questions upon completion of the visit.      Magdalen Spatz, NP 06/06/2022

## 2022-06-06 NOTE — Patient Instructions (Signed)

## 2022-06-08 ENCOUNTER — Ambulatory Visit (HOSPITAL_COMMUNITY)
Admission: RE | Admit: 2022-06-08 | Discharge: 2022-06-08 | Disposition: A | Discharge status: Home / Self Care | Payer: Medicare Other | Source: Ambulatory Visit | Attending: Acute Care | Admitting: Acute Care

## 2022-06-08 DIAGNOSIS — F17210 Nicotine dependence, cigarettes, uncomplicated: Secondary | ICD-10-CM | POA: Insufficient documentation

## 2022-06-08 DIAGNOSIS — Z122 Encounter for screening for malignant neoplasm of respiratory organs: Secondary | ICD-10-CM | POA: Insufficient documentation

## 2022-06-08 DIAGNOSIS — Z87891 Personal history of nicotine dependence: Secondary | ICD-10-CM | POA: Insufficient documentation

## 2022-06-12 ENCOUNTER — Other Ambulatory Visit: Payer: Self-pay | Admitting: Acute Care

## 2022-06-12 DIAGNOSIS — F17210 Nicotine dependence, cigarettes, uncomplicated: Secondary | ICD-10-CM

## 2022-06-12 DIAGNOSIS — Z122 Encounter for screening for malignant neoplasm of respiratory organs: Secondary | ICD-10-CM

## 2022-06-12 DIAGNOSIS — Z87891 Personal history of nicotine dependence: Secondary | ICD-10-CM

## 2022-06-14 ENCOUNTER — Telehealth (HOSPITAL_COMMUNITY): Payer: Self-pay | Admitting: *Deleted

## 2022-06-14 NOTE — Telephone Encounter (Signed)
Reaching out to patient to offer assistance regarding upcoming cardiac imaging study; pt verbalizes understanding of appt date/time, parking situation and where to check in, medications ordered, and verified current allergies; name and call back number provided for further questions should they arise  Rafia Shedden RN Navigator Cardiac Imaging [facility] [phone] office [phone] cell  Patient to take 100mg metoprolol tartrate two hours prior to her cardiac CT scan. She is aware to arrive at 8am. 

## 2022-06-14 NOTE — Telephone Encounter (Signed)
Attempted to call patient regarding upcoming cardiac CT appointment. °Left message on voicemail with name and callback number ° °Shaima Sardinas RN Navigator Cardiac Imaging °[facility] °[phone] Office °[phone] Cell ° °

## 2022-06-15 ENCOUNTER — Ambulatory Visit (HOSPITAL_BASED_OUTPATIENT_CLINIC_OR_DEPARTMENT_OTHER)
Admission: RE | Admit: 2022-06-15 | Discharge: 2022-06-15 | Disposition: A | Discharge status: Home / Self Care | Payer: Medicare Other | Source: Ambulatory Visit | Attending: Cardiovascular Disease | Admitting: Cardiovascular Disease

## 2022-06-15 ENCOUNTER — Ambulatory Visit (HOSPITAL_COMMUNITY)
Admission: RE | Admit: 2022-06-15 | Discharge: 2022-06-15 | Disposition: A | Discharge status: Home / Self Care | Payer: Medicare Other | Source: Ambulatory Visit | Attending: Cardiovascular Disease | Admitting: Cardiovascular Disease

## 2022-06-15 ENCOUNTER — Other Ambulatory Visit: Payer: Self-pay | Admitting: Cardiovascular Disease

## 2022-06-15 DIAGNOSIS — R079 Chest pain, unspecified: Secondary | ICD-10-CM

## 2022-06-15 DIAGNOSIS — I2510 Atherosclerotic heart disease of native coronary artery without angina pectoris: Secondary | ICD-10-CM

## 2022-06-15 DIAGNOSIS — R072 Precordial pain: Secondary | ICD-10-CM | POA: Diagnosis present

## 2022-06-15 DIAGNOSIS — R931 Abnormal findings on diagnostic imaging of heart and coronary circulation: Secondary | ICD-10-CM

## 2022-06-15 MED ORDER — IOHEXOL 350 MG/ML SOLN
100.0000 mL | Freq: Once | INTRAVENOUS | Status: AC | PRN
  Administered 2022-06-15: 100 mL via INTRAVENOUS

## 2022-06-15 MED ORDER — NITROGLYCERIN 0.4 MG SL SUBL
0.8000 mg | SUBLINGUAL_TABLET | Freq: Once | SUBLINGUAL | Status: AC
  Administered 2022-06-15: 0.8 mg via SUBLINGUAL

## 2022-06-15 MED ORDER — NITROGLYCERIN 0.4 MG SL SUBL
SUBLINGUAL_TABLET | SUBLINGUAL | Status: AC
  Filled 2022-06-15: qty 2

## 2022-06-16 ENCOUNTER — Other Ambulatory Visit (HOSPITAL_COMMUNITY): Payer: Self-pay | Admitting: Physician Assistant

## 2022-06-16 ENCOUNTER — Other Ambulatory Visit: Payer: Self-pay | Admitting: *Deleted

## 2022-06-16 DIAGNOSIS — R072 Precordial pain: Secondary | ICD-10-CM

## 2022-06-16 DIAGNOSIS — M75101 Unspecified rotator cuff tear or rupture of right shoulder, not specified as traumatic: Secondary | ICD-10-CM

## 2022-06-16 DIAGNOSIS — E7800 Pure hypercholesterolemia, unspecified: Secondary | ICD-10-CM

## 2022-06-16 MED ORDER — METHOCARBAMOL 500 MG PO TABS: 500.0000 mg | ORAL_TABLET | Freq: Three times a day (TID) | ORAL | 0 refills | Status: DC | PRN

## 2022-06-16 MED ORDER — ROSUVASTATIN CALCIUM 10 MG PO TABS: 10.0000 mg | ORAL_TABLET | Freq: Every day | ORAL | 3 refills | Status: DC

## 2022-06-16 NOTE — Progress Notes (Unsigned)
bil

## 2022-06-22 ENCOUNTER — Ambulatory Visit (HOSPITAL_COMMUNITY)
Admission: RE | Admit: 2022-06-22 | Discharge: 2022-06-22 | Disposition: A | Discharge status: Home / Self Care | Payer: Medicare Other | Source: Ambulatory Visit | Attending: Nurse Practitioner | Admitting: Nurse Practitioner

## 2022-06-22 DIAGNOSIS — Z1231 Encounter for screening mammogram for malignant neoplasm of breast: Secondary | ICD-10-CM | POA: Insufficient documentation

## 2022-06-30 ENCOUNTER — Ambulatory Visit (HOSPITAL_COMMUNITY): Payer: Medicare Other | Attending: Cardiovascular Disease

## 2022-06-30 DIAGNOSIS — R072 Precordial pain: Secondary | ICD-10-CM | POA: Insufficient documentation

## 2022-06-30 LAB — ECHOCARDIOGRAM COMPLETE
Area-P 1/2: 3.17 cm2
P 1/2 time: 416 msec
S' Lateral: 3.7 cm

## 2022-07-19 ENCOUNTER — Other Ambulatory Visit: Payer: Self-pay | Admitting: Orthopaedic Surgery

## 2022-07-19 DIAGNOSIS — M75102 Unspecified rotator cuff tear or rupture of left shoulder, not specified as traumatic: Secondary | ICD-10-CM

## 2022-08-02 ENCOUNTER — Encounter (HOSPITAL_BASED_OUTPATIENT_CLINIC_OR_DEPARTMENT_OTHER): Payer: Self-pay | Admitting: Pulmonary Disease

## 2022-08-02 ENCOUNTER — Ambulatory Visit (INDEPENDENT_AMBULATORY_CARE_PROVIDER_SITE_OTHER): Payer: Medicare Other | Admitting: Pulmonary Disease

## 2022-08-02 VITALS — BP 108/60 | HR 68 | Ht 64.0 in | Wt 228.0 lb

## 2022-08-02 DIAGNOSIS — J4489 Other specified chronic obstructive pulmonary disease: Secondary | ICD-10-CM | POA: Diagnosis not present

## 2022-08-02 DIAGNOSIS — G4733 Obstructive sleep apnea (adult) (pediatric): Secondary | ICD-10-CM | POA: Diagnosis not present

## 2022-08-02 DIAGNOSIS — Z720 Tobacco use: Secondary | ICD-10-CM

## 2022-08-02 MED ORDER — FLUTICASONE-SALMETEROL 500-50 MCG/ACT IN AEPB: 1.0000 | INHALATION_SPRAY | Freq: Two times a day (BID) | RESPIRATORY_TRACT | 5 refills | Status: DC

## 2022-08-02 NOTE — Patient Instructions (Signed)
Moderate persistent asthma +/- COPD - symptomatic, not in exacerbation --INCREASE Advair 500--50 mcg ONE puff TWICE a day --CONTINUE Albuterol AS NEEDED for shortness of breath or wheezing  Moderate OSA Patient uses NIV for more than four hours nightly for at least 70% of nights during the last month of usage. The patient has been using and benefiting from PAP use and will continue to benefit from therapy.  --Counseled on sleep hygiene --Provided handout on exercise and maintenance of healthy weight --Counseled NOT to drive if/when sleepy  Tobacco abuse Patient is an active smoker. We discussed smoking cessation for 3 minutes. We discussed triggers and stressors and ways to deal with them. We discussed barriers to continued smoking and benefits of smoking cessation. Provided patient with information cessation techniques and interventions including [place] quitline.   Follow-up with me in 3 months in March

## 2022-08-02 NOTE — Progress Notes (Signed)
Subjective:   PATIENT ID: Christina Whitehead GENDER: female DOB: 1956/07/05, MRN: [id number]   HPI  Chief Complaint  Patient presents with   Follow-up    Advair not working still wheezing at night    Reason for Visit: Follow-up  [name] is a 66 year old female active smoker with COPD-asthma overlap, OSA, HTN and DM2 who presents for follow-up OSA and COPD-asthma follow-up.  08/02/22 She is compliant with her CPAP and Advair twice a day however continues to have wheezing at night. She does feel her symptoms are improved but does have shortness of breath and wheeze with exertion. Cough unchanged that is productive. Continues to smoke 1.5 ppd.  Social History: Active smoker. Started at 14. 60 pack-years. Currently smoking 1.5 ppd. 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)   Past Medical History:  Diagnosis Date   Anxiety    Diabetes mellitus without complication (HCC)    Hypertension    Neuropathy      Family History  Problem Relation Age of Onset   Stroke Mother    Heart disease Mother    Renal cancer Mother    Breast cancer Maternal Aunt      Social History   Occupational History   Not on file  Tobacco Use   Smoking status: Every Day    Packs/day: 3.00    Years: 52.00    Total pack years: 156.00    Types: Cigarettes    Start date: 29   Smokeless tobacco: Never   Tobacco comments:    Currently smoking 1-1.5ppd as of 02/01/22 ep  Vaping Use   Vaping Use: Never used  Substance and Sexual Activity   Alcohol use: No   Drug use: No   Sexual activity: Not on file    Allergies  Allergen Reactions   Lisinopril Rash and Swelling    Diffuse rash. No throat swelling   Penicillins Hives    Rash Has patient had a PCN reaction causing immediate rash, facial/tongue/throat swelling, SOB or lightheadedness with hypotension: YES Has patient had a PCN reaction causing severe rash involving mucus membranes or skin necrosis: NO Has patient had a PCN  reaction that required hospitalization: NO Has patient had a PCN reaction occurring within the last 10 years: NO} If all of the above answers are "NO", then may proceed with Cephalosporin use.      Outpatient Medications Prior to Visit  Medication Sig Dispense Refill   albuterol (VENTOLIN HFA) 108 (90 Base) MCG/ACT inhaler Inhale 2 puffs into the lungs every 6 (six) hours as needed for wheezing or shortness of breath. 8 g 6   aspirin EC 81 MG tablet Take 81 mg by mouth daily. Swallow whole.     calcium carbonate (OSCAL) 1500 (600 Ca) MG TABS tablet Take by mouth.     escitalopram (LEXAPRO) 20 MG tablet TAKE 1/2 TABLET BY MOUTH DAILY FOR 7 DAYS THEN TAKE ONE TABLET BY MOUTH DAILY     gabapentin (NEURONTIN) 300 MG capsule TAKE THREE CAPSULES BY MOUTH THREE TIMES A DAY     hydrochlorothiazide (HYDRODIURIL) 25 MG tablet Take 1 tablet (25 mg total) by mouth daily. 90 tablet 3   metFORMIN (GLUCOPHAGE) 1000 MG tablet Take 1,000 mg by mouth 2 (two) times daily with a meal.     pregabalin (LYRICA) 300 MG capsule Take 1 capsule by mouth twice a day as directed by physician.     rosuvastatin (CRESTOR) 10 MG tablet Take 1 tablet (  10 mg total) by mouth daily. 90 tablet 3   vitamin B-12 (CYANOCOBALAMIN) 100 MCG tablet      fluticasone-salmeterol (ADVAIR DISKUS) 250-50 MCG/ACT AEPB Inhale 1 puff into the lungs in the morning and at bedtime. 60 each 5   buPROPion (WELLBUTRIN XL) 300 MG 24 hr tablet Take 1 tablet by mouth daily.     busPIRone (BUSPAR) 10 MG tablet Take 10 mg by mouth 2 (two) times daily.     methocarbamol (ROBAXIN) 500 MG tablet Take 1 tablet (500 mg total) by mouth every 8 (eight) hours as needed for muscle spasms. 20 tablet 0   metoprolol tartrate (LOPRESSOR) 100 MG tablet Take one tablet two hours prior to the test 1 tablet 0   No facility-administered medications prior to visit.    Review of Systems  Constitutional:  Negative for chills, diaphoresis, fever, malaise/fatigue and weight  loss.  HENT:  Negative for congestion.   Respiratory:  Positive for cough, shortness of breath and wheezing. Negative for hemoptysis and sputum production.   Cardiovascular:  Negative for chest pain, palpitations and leg swelling.     Objective:   Vitals:   08/02/22 0933  BP: 108/60  Pulse: 68  SpO2: 96%  Weight: 228 lb (103.4 kg)  Height: 5\' 4"  (1.626 m)   SpO2: 96 % O2 Device: None (Room air)  Body mass index is 39.14 kg/m.   Physical Exam: General: Well-appearing, no acute distress HENT: [place], AT Eyes: EOMI, no scleral icterus Respiratory: Clear to auscultation bilaterally.  No crackles, wheezing or rales Cardiovascular: RRR, -M/R/G, no JVD Extremities:-Edema,-tenderness Neuro: AAO x4, CNII-XII grossly intact Psych: Normal mood, normal affect  Data Reviewed:  Imaging: CXR 06/03/12 - No acute cardiopulmonary disease CT Coronary 06/15/22 - Visualized lung parenchyma with no pulmonary nodules, masses, infiltrate, effusion or pneumothorax.  PFT: 11/23/2021 FVC 1.97 (62%) FEV1 1.61 (67%) ratio 91 TLC 83% DLCO 85% Interpretation: Reduced FEV1 and FVC with significant bronchodilator response present.  No restrictive defect and normal gas exchange.  Study suggestive of asthma  Sleep: Split night 11/03/21 - AHI 26.7 and nadir SpO2 85%. CPAP 12 cm H20 recommended.  Labs: Absolute eos 03/18/21 - 200      Latest Ref Rng & Units 06/04/2012    4:43 AM 06/03/2012   10:40 AM  CBC  WBC 4.0 - 10.5 K/uL 6.9  6.3   Hemoglobin 12.0 - 15.0 g/dL 06/05/2012  12.8   Hematocrit 36.0 - 46.0 % 45.6  44.7   Platelets 150 - 400 K/uL 236  230    Hg 16.1. Polycythemia > slightly improving to 15.5  CPAP Compliance 12/02/21-01/30/22 Usage days 53/60 days (88%) >4 hours 39 days (65%) CPAP 12 cm H20 AHI 0.4  CPAP Compliant 07/03/22-08/01/22 Usage days 28/30 days (93%) >4 hours 27 days (90%) CPAP 12 cm H20 AHI 0.6  Assessment & Plan:   Discussion: 66 year old female active smoker with  COPD-asthma overlap, OSA, HTN and DM2 who presents for follow-up. Continues to smoke. Counseled on smoking cessation. Continues to have symptoms despite compliance with current therapy  Moderate persistent asthma +/- COPD - symptomatic, not in exacerbation --INCREASE Advair 500--50 mcg ONE puff TWICE a day --CONTINUE Albuterol AS NEEDED for shortness of breath or wheezing  Moderate OSA Patient uses NIV for more than four hours nightly for at least 70% of nights during the last month of usage. The patient has been using and benefiting from PAP use and will continue to benefit from therapy.  --  Counseled on sleep hygiene --Provided handout on exercise and maintenance of healthy weight --Counseled NOT to drive if/when sleepy  Tobacco abuse Patient is an active smoker. We discussed smoking cessation for 3 minutes. We discussed triggers and stressors and ways to deal with them. We discussed barriers to continued smoking and benefits of smoking cessation. Provided patient with information cessation techniques and interventions including [place] quitline.   Health Maintenance Immunization History  Administered Date(s) Administered   Influenza Inj Mdck Quad Pf 05/05/2022   Moderna Sars-Covid-2 Vaccination 10/29/2019, 11/26/2019, 07/12/2020   CT Lung Screen - Scheduled  No orders of the defined types were placed in this encounter.  Meds ordered this encounter  Medications   fluticasone-salmeterol (ADVAIR DISKUS) 500-50 MCG/ACT AEPB    Sig: Inhale 1 puff into the lungs in the morning and at bedtime.    Dispense:  60 each    Refill:  5    Return in about 3 months (around 11/01/2022).  I have spent a total time of 30-minutes on the day of the appointment including chart review, data review, collecting history, coordinating care and discussing medical diagnosis and plan with the patient/family. Past medical history, allergies, medications were reviewed. Pertinent imaging, labs and tests included in  this note have been reviewed and interpreted independently by me.  Baylor Teegarden Mechele Collin, MD [facility] 08/02/2022 9:46 AM  Office Number [phone]

## 2022-08-04 ENCOUNTER — Telehealth: Payer: Self-pay | Admitting: Pulmonary Disease

## 2022-08-04 NOTE — Telephone Encounter (Signed)
Please send a test script for the following:  Advair Diskus 500 Advair HFA 230  Symbicort 160 Incruse

## 2022-08-08 ENCOUNTER — Other Ambulatory Visit (HOSPITAL_COMMUNITY): Payer: Self-pay

## 2022-08-08 NOTE — Telephone Encounter (Signed)
Members pharmacy benefits card has not been scanned into patients profile. This is needed to conduct test claims/benefits investigations.

## 2022-08-10 ENCOUNTER — Ambulatory Visit
Admission: RE | Admit: 2022-08-10 | Discharge: 2022-08-10 | Disposition: A | Discharge status: Home / Self Care | Payer: Medicare Other | Source: Ambulatory Visit | Attending: Orthopaedic Surgery | Admitting: Orthopaedic Surgery

## 2022-08-10 DIAGNOSIS — M75102 Unspecified rotator cuff tear or rupture of left shoulder, not specified as traumatic: Secondary | ICD-10-CM

## 2022-08-10 MED ORDER — IOPAMIDOL (ISOVUE-M 200) INJECTION 41%
15.0000 mL | Freq: Once | INTRAMUSCULAR | Status: AC
  Administered 2022-08-10: 15 mL via INTRA_ARTICULAR

## 2022-08-11 MED ORDER — PREDNISONE 10 MG PO TABS: ORAL_TABLET | ORAL | 0 refills | Status: AC

## 2022-08-11 NOTE — Telephone Encounter (Signed)
Contacted patient this afternoon.  Patient prefers to continue Advair 500 as additional inhaler would increase overall cost.  She did have asthma attack this morning. Wheezing, cough and shortness of breath  Assessment/Plan  COPD-asthma exacerbation --START Advair 500-50 ONE puff TWICE a day --START prednisone taper   Closing encounter

## 2022-08-11 NOTE — Telephone Encounter (Signed)
Attempted to contact patient x 2. No answer. Left voicemail regarding patient needing pharmacy benefits card. Asked patient to contact office back regarding inhaler options.  Advair listed as preferred however she reports $150 cost  Spiriva on formulary and not preferred. Incruse is option but unsure of cost. This inhaler would be in addition to her current Advair  Assessment/Plan  COPD-asthma overlap --Recommend the following inhaler regimen: Advair 250-50 mcg ONE puff TWICE a day    PLUS   Incruse ONE puff ONCE a day  If patient returns call and is ok with plan, please order inhalers

## 2022-09-20 ENCOUNTER — Other Ambulatory Visit (HOSPITAL_COMMUNITY): Payer: Self-pay | Admitting: Neurological Surgery

## 2022-09-20 DIAGNOSIS — G959 Disease of spinal cord, unspecified: Secondary | ICD-10-CM

## 2022-09-28 ENCOUNTER — Telehealth (HOSPITAL_BASED_OUTPATIENT_CLINIC_OR_DEPARTMENT_OTHER): Payer: Self-pay | Admitting: *Deleted

## 2022-09-28 NOTE — Telephone Encounter (Signed)
   Name: Christina Whitehead  DOB: [birth year]  MRN: [id number]  Primary Cardiologist: None   Preoperative team, please contact this patient and set up a phone call appointment for further preoperative risk assessment. Please obtain consent and complete medication review. Thank you for your help.  I confirm that guidance regarding antiplatelet and oral anticoagulation therapy has been completed and, if necessary, noted below.  None requested   Mable Fill, Marissa Nestle, NP 09/28/2022, 3:48 PM [place]

## 2022-09-28 NOTE — Telephone Encounter (Signed)
DR. Sallyanne Kuster IS PRIMARY CARD. I left message to call back to schedule a tele pre op appt.

## 2022-09-28 NOTE — Telephone Encounter (Signed)
   Pre-operative Risk Assessment    Patient Name: Christina Whitehead  DOB: 03-Oct-1955 MRN: [id number]      Request for Surgical Clearance    Procedure:   Left Shoulder Scope, Rotator Cuff Repair  Date of Surgery:  Clearance TBD                                 Surgeon:  Ophelia Charter, M.D. Surgeon's Group or Practice Name:  [facility] Phone number:  [phone] ext [phone] Fax number:  [phone]   Type of Clearance Requested:   - Medical    Type of Anesthesia:  General  with interscalene block   Additional requests/questions:    [name]   09/28/2022, 3:17 PM

## 2022-10-02 ENCOUNTER — Telehealth: Payer: Self-pay | Admitting: *Deleted

## 2022-10-02 NOTE — Telephone Encounter (Signed)
Pt has been scheduled for tele pre op appt 10/06/22 @ 2:40. Med rec and consent are done.

## 2022-10-02 NOTE — Telephone Encounter (Signed)
Pt has been scheduled for tele pre op appt 10/06/22 @ 2:40. Med rec and consent are done.     Patient Consent for Virtual Visit        Christina Whitehead has provided verbal consent on 10/02/2022 for a virtual visit (video or telephone).   CONSENT FOR VIRTUAL VISIT FOR:  Christina Whitehead  By participating in this virtual visit I agree to the following:  I hereby voluntarily request, consent and authorize [facility] and its employed or contracted physicians, physician assistants, nurse practitioners or other licensed health care professionals (the Practitioner), to provide me with telemedicine health care services (the "Services") as deemed necessary by the treating Practitioner. I acknowledge and consent to receive the Services by the Practitioner via telemedicine. I understand that the telemedicine visit will involve communicating with the Practitioner through live audiovisual communication technology and the disclosure of certain medical information by electronic transmission. I acknowledge that I have been given the opportunity to request an in-person assessment or other available alternative prior to the telemedicine visit and am voluntarily participating in the telemedicine visit.  I understand that I have the right to withhold or withdraw my consent to the use of telemedicine in the course of my care at any time, without affecting my right to future care or treatment, and that the Practitioner or I may terminate the telemedicine visit at any time. I understand that I have the right to inspect all information obtained and/or recorded in the course of the telemedicine visit and may receive copies of available information for a reasonable fee.  I understand that some of the potential risks of receiving the Services via telemedicine include:  Delay or interruption in medical evaluation due to technological equipment failure or disruption; Information transmitted may not be sufficient (e.g.  poor resolution of images) to allow for appropriate medical decision making by the Practitioner; and/or  In rare instances, security protocols could fail, causing a breach of personal health information.  Furthermore, I acknowledge that it is my responsibility to provide information about my medical history, conditions and care that is complete and accurate to the best of my ability. I acknowledge that Practitioner's advice, recommendations, and/or decision may be based on factors not within their control, such as incomplete or inaccurate data provided by me or distortions of diagnostic images or specimens that may result from electronic transmissions. I understand that the practice of medicine is not an exact science and that Practitioner makes no warranties or guarantees regarding treatment outcomes. I acknowledge that a copy of this consent can be made available to me via my patient portal ([place]), or I can request a printed copy by calling the office of [facility].    I understand that my insurance will be billed for this visit.   I have read or had this consent read to me. I understand the contents of this consent, which adequately explains the benefits and risks of the Services being provided via telemedicine.  I have been provided ample opportunity to ask questions regarding this consent and the Services and have had my questions answered to my satisfaction. I give my informed consent for the services to be provided through the use of telemedicine in my medical care

## 2022-10-05 ENCOUNTER — Telehealth: Payer: Self-pay | Admitting: Pulmonary Disease

## 2022-10-05 NOTE — Telephone Encounter (Signed)
Fax received from Dr. Ophelia Charter with [facility] to perform a LEFT SHOULDER SCOPE AND REPAIR CUFF REPAIR WITH GENERAL ANESTHESIA on patient.  Patient needs surgery clearance. Surgery is PENDING. Patient was seen on 08/02/2022. Office protocol is a risk assessment can be sent to surgeon if patient has been seen in 60 days or less.   Sending to Dr Loanne Drilling for risk assessment or recommendations if patient needs to be seen in office prior to surgical procedure.    Patient has office visit with Dr Loanne Drilling on 11/08/2022 and can get her surgical clearance done at the follow up

## 2022-10-05 NOTE — Telephone Encounter (Signed)
Agree with plan. If patient needs sooner appointment or knows surgical date, please have her contact office so I can review my schedule for overbook slots.

## 2022-10-06 ENCOUNTER — Ambulatory Visit (INDEPENDENT_AMBULATORY_CARE_PROVIDER_SITE_OTHER): Payer: Medicare Other

## 2022-10-06 DIAGNOSIS — Z01810 Encounter for preprocedural cardiovascular examination: Secondary | ICD-10-CM

## 2022-10-06 NOTE — Progress Notes (Signed)
   Attempted to contact patient x 2 and got voice mail, left message that I would try again. Preoperative team attempted call and left message to call back for rescheduling.    Mayra Reel, NP  10/06/2022, 2:39 PM

## 2022-10-09 ENCOUNTER — Telehealth: Payer: Self-pay | Admitting: Cardiovascular Disease

## 2022-10-09 NOTE — Telephone Encounter (Signed)
Patient is calling to reschedule virtual preop appt she missed on 02/16. Please advise.

## 2022-10-09 NOTE — Telephone Encounter (Signed)
S/w the pt and she has been rescheduled for her tele visit pre op appt.

## 2022-10-12 ENCOUNTER — Ambulatory Visit: Payer: Medicare Other | Attending: Cardiology | Admitting: Nurse Practitioner

## 2022-10-12 DIAGNOSIS — Z01810 Encounter for preprocedural cardiovascular examination: Secondary | ICD-10-CM

## 2022-10-12 NOTE — Progress Notes (Signed)
Virtual Visit via Telephone Note   Because of Christina Whitehead's co-morbid illnesses, she is at least at moderate risk for complications without adequate follow up.  This format is felt to be most appropriate for this patient at this time.  The patient did not have access to video technology/had technical difficulties with video requiring transitioning to audio format only (telephone).  All issues noted in this document were discussed and addressed.  No physical exam could be performed with this format.  Please refer to the patient's chart for her consent to telehealth for [facility].  Evaluation Performed:  Preoperative cardiovascular risk assessment _____________   Date:  10/12/2022   Patient ID:  Christina Whitehead, Christina Whitehead May 23, 1956, MRN [id number] Patient Location:  Home Provider location:   Office  Primary Care Provider:  No primary care provider on file. Primary Cardiologist:  Sanda Klein, MD  Chief Complaint / Patient Profile   67 y.o. y/o female with a h/o abnormal EKG, nonobstructive CAD, hypertension, former tobacco use, OSA, and type 2 diabetes who is pending shoulder scope, rotator cuff repair with Dr. Ophelia Charter of [facility] and presents today for telephonic preoperative cardiovascular risk assessment.  History of Present Illness    Christina Whitehead is a 67 y.o. female who presents via audio/video conferencing for a telehealth visit today.  Pt was last seen in cardiology clinic on 05/31/2022 by Dr. Sallyanne Kuster.  At that time Christina Whitehead was doing well. Coronary CTA showed nonobstructive CAD. The patient is now pending procedure as outlined above. Since her last visit, she has done well from a cardiac standpoint.   She denies chest pain, palpitations, dyspnea, pnd, orthopnea, n, v, dizziness, syncope, edema, weight gain, or early satiety. All other systems reviewed and are otherwise negative except as noted above.   Past Medical History    Past  Medical History:  Diagnosis Date   Anxiety    Diabetes mellitus without complication ([place])    Hypertension    Neuropathy    Past Surgical History:  Procedure Laterality Date   ABDOMINAL HYSTERECTOMY     CESAREAN SECTION     x2    Allergies  Allergies  Allergen Reactions   Lisinopril Rash and Swelling    Diffuse rash. No throat swelling   Penicillins Hives    Rash Has patient had a PCN reaction causing immediate rash, facial/tongue/throat swelling, SOB or lightheadedness with hypotension: YES Has patient had a PCN reaction causing severe rash involving mucus membranes or skin necrosis: NO Has patient had a PCN reaction that required hospitalization: NO Has patient had a PCN reaction occurring within the last 10 years: NO} If all of the above answers are "NO", then may proceed with Cephalosporin use.     Home Medications    Prior to Admission medications   Medication Sig Start Date End Date Taking? Authorizing Provider  albuterol (VENTOLIN HFA) 108 (90 Base) MCG/ACT inhaler Inhale 2 puffs into the lungs every 6 (six) hours as needed for wheezing or shortness of breath. 11/23/21   Margaretha Seeds, MD  ascorbic acid (VITAMIN C) 500 MG tablet Take 500 mg by mouth daily.    [provider]  aspirin EC 81 MG tablet Take 81 mg by mouth daily. Swallow whole.    [provider]  buPROPion (WELLBUTRIN XL) 300 MG 24 hr tablet Take 1 tablet by mouth daily. 05/17/22   [provider]  busPIRone (BUSPAR) 10 MG tablet Take 10  mg by mouth 2 (two) times daily. Patient not taking: Reported on 10/02/2022 01/18/17   [provider]  calcium carbonate (OSCAL) 1500 (600 Ca) MG TABS tablet Take by mouth.    [provider]  cholecalciferol (VITAMIN D3) 25 MCG (1000 UNIT) tablet Take 1,000 Units by mouth daily.    [provider]  clonazePAM (KLONOPIN) 0.5 MG tablet Take 0.5 mg by mouth 2 (two) times daily. 09/01/22   [provider]   escitalopram (LEXAPRO) 20 MG tablet Take 20 mg by mouth daily. 03/01/21   [provider]  fluticasone-salmeterol (ADVAIR DISKUS) 500-50 MCG/ACT AEPB Inhale 1 puff into the lungs in the morning and at bedtime. 08/02/22   Margaretha Seeds, MD  gabapentin (NEURONTIN) 300 MG capsule TAKE THREE CAPSULES BY MOUTH THREE TIMES A DAY 01/25/17   [provider]  hydrochlorothiazide (HYDRODIURIL) 25 MG tablet Take 1 tablet (25 mg total) by mouth daily. Patient taking differently: Take 12.5 mg by mouth daily. 06/26/12   Imogene Burn, PA-C  metFORMIN (GLUCOPHAGE) 1000 MG tablet Take 1,000 mg by mouth 2 (two) times daily with a meal.    [provider]  methocarbamol (ROBAXIN) 500 MG tablet Take 1 tablet (500 mg total) by mouth every 8 (eight) hours as needed for muscle spasms. 06/16/22   McBane, Maylene Roes, PA-C  metoprolol tartrate (LOPRESSOR) 100 MG tablet Take one tablet two hours prior to the test Patient not taking: Reported on 10/02/2022 05/31/22   Croitoru, Mihai, MD  pregabalin (LYRICA) 300 MG capsule Take 1 capsule by mouth twice a day as directed by physician. 09/23/21   [provider]  rosuvastatin (CRESTOR) 10 MG tablet Take 1 tablet (10 mg total) by mouth daily. 06/16/22 06/11/23  Croitoru, Mihai, MD  vitamin B-12 (CYANOCOBALAMIN) 100 MCG tablet  03/01/21   [provider]    Physical Exam    Vital Signs:  Christina Whitehead does not have vital signs available for review today.  Given telephonic nature of communication, physical exam is limited. AAOx3. NAD. Normal affect.  Speech and respirations are unlabored.  Accessory Clinical Findings    None  Assessment & Plan    1.  Preoperative Cardiovascular Risk Assessment:  According to the Revised Cardiac Risk Index (RCRI), her Perioperative Risk of Major Cardiac Event is (%): 0.4. Her Functional Capacity in METs is: 6.36 according to the [place] Activity Status Index (DASI).Therefore, based on ACC/AHA  guidelines, patient would be at acceptable risk for the planned procedure without further cardiovascular testing.  The patient was advised that if she develops new symptoms prior to surgery to contact our office to arrange for a follow-up visit, and she verbalized understanding.  Per office protocol, she may hold Aspirin for 5-7 days prior to procedure. Please resume Aspirin as soon as possible postprocedure, at the discretion of the surgeon.   A copy of this note will be routed to requesting surgeon.  Time:   Today, I have spent 5 minutes with the patient with telehealth technology discussing medical history, symptoms, and management plan.     Lenna Sciara, NP  10/12/2022, 3:08 PM

## 2022-10-19 ENCOUNTER — Ambulatory Visit (HOSPITAL_COMMUNITY)
Admission: RE | Admit: 2022-10-19 | Discharge: 2022-10-19 | Disposition: A | Discharge status: Home / Self Care | Payer: Medicare Other | Source: Ambulatory Visit | Attending: Neurological Surgery | Admitting: Neurological Surgery

## 2022-10-19 DIAGNOSIS — G959 Disease of spinal cord, unspecified: Secondary | ICD-10-CM | POA: Insufficient documentation

## 2022-11-08 ENCOUNTER — Ambulatory Visit (INDEPENDENT_AMBULATORY_CARE_PROVIDER_SITE_OTHER): Payer: Medicare Other | Admitting: Pulmonary Disease

## 2022-11-08 ENCOUNTER — Encounter (HOSPITAL_BASED_OUTPATIENT_CLINIC_OR_DEPARTMENT_OTHER): Payer: Self-pay | Admitting: Pulmonary Disease

## 2022-11-08 VITALS — BP 110/70 | HR 69 | Ht 64.0 in | Wt 230.7 lb

## 2022-11-08 DIAGNOSIS — J4489 Other specified chronic obstructive pulmonary disease: Secondary | ICD-10-CM | POA: Diagnosis not present

## 2022-11-08 DIAGNOSIS — Z720 Tobacco use: Secondary | ICD-10-CM

## 2022-11-08 DIAGNOSIS — G4733 Obstructive sleep apnea (adult) (pediatric): Secondary | ICD-10-CM | POA: Diagnosis not present

## 2022-11-08 MED ORDER — PREDNISONE 10 MG PO TABS: ORAL_TABLET | ORAL | 0 refills | Status: AC

## 2022-11-08 MED ORDER — FLUTICASONE-SALMETEROL 500-50 MCG/ACT IN AEPB: 1.0000 | INHALATION_SPRAY | Freq: Two times a day (BID) | RESPIRATORY_TRACT | 5 refills | Status: DC

## 2022-11-08 NOTE — Progress Notes (Addendum)
Subjective:   PATIENT ID: Christina Whitehead, Christina Whitehead, MRN: [id number]   HPI  Chief Complaint  Patient presents with   Follow-up    Had flu 2 wks ago and is now using a neb machine and it's working well    Reason for Visit: Follow-up  [name] is a 67 year old female active smoker with COPD-asthma overlap, OSA, HTN and DM2 who presents for follow-up OSA and COPD-asthma follow-up.  08/02/22 She is compliant with her CPAP and Advair twice a day however continues to have wheezing at night. She does feel her symptoms are improved but does have shortness of breath and wheeze with exertion. Cough unchanged that is productive. Continues to smoke 1.5 ppd.  11/08/22 She recent had an upper viral respiratory (flu negative) earlier this month, recent sick contact with RSV. Testing was neg but likely caught it from granddaughter. Has cough, shortness of breath or wheezing that remains persistent. PCP treated with depo medrol shot and atrovent nebulizer and azithromycin. She is compliant with Advair 250. Has not picked up the 500 strength yet due to the cost. Reduced smoking from 1.5 ppd > 1 ppd. Compliant with CPAP and wears >4 hours nightly.  Social History: Active smoker. Started at 74. 60 pack-years. Previously smoking 1.5 ppd. 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)   Past Medical History:  Diagnosis Date   Anxiety    Diabetes mellitus without complication ([place])    Hypertension    Neuropathy      Family History  Problem Relation Age of Onset   Stroke Mother    Heart disease Mother    Renal cancer Mother    Breast cancer Maternal Aunt      Social History   Occupational History   Not on file  Tobacco Use   Smoking status: Every Day    Packs/day: 3.00    Years: 52.00    Additional pack years: 0.00    Total pack years: 156.00    Types: Cigarettes    Start date: 73   Smokeless tobacco: Never   Tobacco comments:    Currently smoking  1-1.5ppd as of 6/Whitehead/23 ep  Vaping Use   Vaping Use: Never used  Substance and Sexual Activity   Alcohol use: No   Drug use: No   Sexual activity: Not on file    Allergies  Allergen Reactions   Lisinopril Rash and Swelling    Diffuse rash. No throat swelling   Penicillins Hives    Rash Has patient had a PCN reaction causing immediate rash, facial/tongue/throat swelling, SOB or lightheadedness with hypotension: YES Has patient had a PCN reaction causing severe rash involving mucus membranes or skin necrosis: NO Has patient had a PCN reaction that required hospitalization: NO Has patient had a PCN reaction occurring within the last 10 years: NO} If all of the above answers are "NO", then may proceed with Cephalosporin use.      Outpatient Medications Prior to Visit  Medication Sig Dispense Refill   albuterol (VENTOLIN HFA) 108 (90 Base) MCG/ACT inhaler Inhale 2 puffs into the lungs every 6 (six) hours as needed for wheezing or shortness of breath. 8 g 6   ascorbic acid (VITAMIN C) 500 MG tablet Take 500 mg by mouth daily.     aspirin EC 81 MG tablet Take 81 mg by mouth daily. Swallow whole.     busPIRone (BUSPAR) 10 MG tablet Take 10 mg by mouth 2 (two)  times daily.     calcium carbonate (OSCAL) 1500 (600 Ca) MG TABS tablet Take by mouth.     cholecalciferol (VITAMIN D3) 25 MCG (1000 UNIT) tablet Take 1,000 Units by mouth daily.     clonazePAM (KLONOPIN) 0.5 MG tablet Take 0.5 mg by mouth 2 (two) times daily.     escitalopram (LEXAPRO) 20 MG tablet Take 20 mg by mouth daily.     gabapentin (NEURONTIN) 300 MG capsule TAKE THREE CAPSULES BY MOUTH THREE TIMES A DAY     hydrochlorothiazide (HYDRODIURIL) 25 MG tablet Take 1 tablet (25 mg total) by mouth daily. (Patient taking differently: Take 12.5 mg by mouth daily.) 90 tablet 3   metFORMIN (GLUCOPHAGE) 1000 MG tablet Take 1,000 mg by mouth 2 (two) times daily with a meal.     pregabalin (LYRICA) 300 MG capsule Take 1 capsule by mouth  twice a day as directed by physician.     rosuvastatin (CRESTOR) 10 MG tablet Take 1 tablet (10 mg total) by mouth daily. 90 tablet 3   vitamin B-12 (CYANOCOBALAMIN) 100 MCG tablet      fluticasone-salmeterol (ADVAIR DISKUS) 500-50 MCG/ACT AEPB Inhale 1 puff into the lungs in the morning and at bedtime. 60 each 5   metoprolol tartrate (LOPRESSOR) 100 MG tablet Take one tablet two hours prior to the test 1 tablet 0   buPROPion (WELLBUTRIN XL) 300 MG 24 hr tablet Take 1 tablet by mouth daily.     methocarbamol (ROBAXIN) 500 MG tablet Take 1 tablet (500 mg total) by mouth every 8 (eight) hours as needed for muscle spasms. 20 tablet 0   No facility-administered medications prior to visit.    Review of Systems  Constitutional:  Negative for chills, diaphoresis, fever, malaise/fatigue and weight loss.  HENT:  Negative for congestion.   Respiratory:  Positive for cough, shortness of breath and wheezing. Negative for hemoptysis and sputum production.   Cardiovascular:  Negative for chest pain, palpitations and leg swelling.     Objective:   Vitals:   11/08/22 0813  BP: 110/70  Pulse: 69  SpO2: 93%  Weight: 230 lb 11.4 oz (104.6 kg)  Height: 5\' 4"  (1.626 m)   SpO2: 93 % O2 Device: None (Room air)  Body mass index is 39.6 kg/m.  Physical Exam: General: Well-appearing, no acute distress HENT: , AT Eyes: EOMI, no scleral icterus Respiratory: Diminished but cear to auscultation bilaterally.  No crackles, wheezing or rales Cardiovascular: RRR, -M/R/G, no JVD Extremities:-Edema,-tenderness Neuro: AAO x4, CNII-XII grossly intact Psych: Normal mood, normal affect  Data Reviewed:  Imaging: CXR 10/Whitehead/13 - No acute cardiopulmonary disease CT Coronary 06/15/22 - Visualized lung parenchyma with no pulmonary nodules, masses, infiltrate, effusion or pneumothorax.  PFT: 11/23/2021 FVC 1.97 (62%) FEV1 1.61 (67%) ratio 91 TLC 83% DLCO 85% Interpretation: Reduced FEV1 and FVC with  significant bronchodilator response present.  No restrictive defect and normal gas exchange.  Study suggestive of asthma  Sleep: Split night 11/03/21 - AHI 26.7 and nadir SpO2 85%. CPAP 12 cm H20 recommended.  Labs: Absolute eos 03/18/21 - 200      Latest Ref Rng & Units 06/04/2012    4:43 AM 10/Whitehead/2013   10:40 AM  CBC  WBC 4.0 - 10.5 K/uL 6.9  6.3   Hemoglobin 12.0 - 15.0 g/dL 15.5  15.5   Hematocrit 36.0 - 46.0 % 45.6  44.7   Platelets 150 - 400 K/uL 236  230    Labs 2/Whitehead/24 [facility] WBC 9.5  Hg 15.5 Plt 231 CMET overall normal. Albumin 4.4 normal   CPAP Compliant 09/08/22-11/06/22 Usage days 60/60 days (100%) >4 hours 54 days (90%) CPAP 12 cm H20 AHI 0.8  Assessment & Plan:   Discussion: 67 year old female active smoker with COPD-asthma overlap, OSA, HTN and DM2 who presents for follow-up for COPD/asthma. Symptoms exacerbated by viral illness. Has not yet started high dose ICS/LABA. Counseled on tobacco use and not ready to quit. Waiting for surgeon to give her a date before quitting. Advised to be proactive and continue cutting down. Discussed clinical course and management of COPD/asthma including bronchodilator regimen, preventive care including vaccinations and action plan for exacerbation.  Moderate persistent asthma +/- COPD - symptomatic and in mild exacerbation Asthma exacerbation --START prednisone taper --START/CONTINUE Advair 500-50 mcg ONE puff TWICE a day --CONTINUE Albuterol AS NEEDED for shortness of breath or wheezing  Moderate OSA Patient uses NIV for more than four hours nightly for at least 70% of nights during the last three months of usage. The patient has been using and benefiting from PAP use and will continue to benefit from therapy.  --Counseled on sleep hygiene --Counseled on weight loss/maintenance of healthy weight. Working with PCP --Counseled NOT to drive if/when sleepy --Advised patient to wear CPAP for at least 4 hours each night for  greater than 70% of the time to avoid the machine being repossessed by insurance.  Tobacco abuse Patient is an active smoker. Motivator would be able to get back surgery We discussed smoking cessation for 3 minutes. We discussed triggers and stressors and ways to deal with them. We discussed barriers to continued smoking and benefits of smoking cessation. Provided patient with information cessation techniques and interventions. --Did not tolerate wellbutrin due to tremors  Peri-operative Assessment of Pulmonary Risk for Non-Thoracic Surgery:  LEFT SHOULDER SCOPE AND REPAIR CUFF REPAIR WITH GENERAL ANESTHESIA   For Ms. Budde, risk of perioperative pulmonary complications is increased by:  Valu.Nieves ] Age greater than 38 years  [X]  COPD  [  ] Serum albumin <3.5  [X]  Smoking  [X]  Obstructive sleep apnea  [  ] NYHA Class II Pulmonary Hypertension  ARISCAT - LOW to INTERMEDIATE risk - 1.6%-13.3% risk of in-hospital post-op pulmonary complications (composite including respiratory failure, respiratory infection, pleural effusion, atelectasis, pneumothorax, bronchospasm, aspiration pneumonitis)  Respiratory complications generally occur in 1% of ASA Class I patients, 5% of ASA Class II and 10% of ASA Class III-IV patients These complications rarely result in mortality and include postoperative pneumonia, atelectasis, pulmonary embolism, ARDS and increased time requiring postoperative mechanical ventilation.  Overall, I recommend proceeding with the surgery if the risk for respiratory complications are outweighed by the potential benefits. This will need to be discussed between the patient and surgeon.  To reduce risks of respiratory complications, I recommend: --Smoking cessation 6-8 weeks if possible --Pre- and post-operative incentive spirometry performed frequently while awake --Inpatient use of currently prescribed positive-pressure for OSA (CPAP 12 cm H20) whenever the patient is  sleeping --Avoiding use of pancuronium during anesthesia.  I have discussed the risk factors and recommendations above with the patient.    Health Maintenance Immunization History  Administered Date(s) Administered   Influenza Inj Mdck Quad Pf 05/05/2022   Moderna Sars-Covid-2 Vaccination 10/29/2019, 11/26/2019, 07/12/2020   CT Lung Screen - Scheduled  No orders of the defined types were placed in this encounter.  Meds ordered this encounter  Medications   fluticasone-salmeterol (ADVAIR DISKUS) 500-50 MCG/ACT AEPB    Sig:  Inhale 1 puff into the lungs in the morning and at bedtime.    Dispense:  60 each    Refill:  5   predniSONE (DELTASONE) 10 MG tablet    Sig: Take 4 tablets (40 mg total) by mouth daily with breakfast for 2 days, THEN 3 tablets (30 mg total) daily with breakfast for 2 days, THEN 2 tablets (20 mg total) daily with breakfast for 2 days, THEN 1 tablet (10 mg total) daily with breakfast for 2 days.    Dispense:  20 tablet    Refill:  0    Return in about 3 months (around 02/08/2023) for routine follow-up, with Dr. Loanne Drilling.  I have spent a total time of 35-minutes on the day of the appointment including chart review, data review, collecting history, coordinating care and discussing medical diagnosis and plan with the patient/family. Past medical history, allergies, medications were reviewed. Pertinent imaging, labs and tests included in this note have been reviewed and interpreted independently by me.  [place], MD [facility] 11/08/2022 8:40 AM  Office Number [phone]

## 2022-11-08 NOTE — Patient Instructions (Addendum)
Moderate persistent asthma +/- COPD - symptomatic and in mild exacerbation --START prednisone taper --START/CONTINUE Advair 500-50 mcg ONE puff TWICE a day --CONTINUE Albuterol AS NEEDED for shortness of breath or wheezing  Moderate OSA Patient uses NIV for more than four hours nightly for at least 70% of nights during the last three months of usage. The patient has been using and benefiting from PAP use and will continue to benefit from therapy.  --Counseled on sleep hygiene --Counseled on weight loss/maintenance of healthy weight. Working with PCP --Counseled NOT to drive if/when sleepy --Advised patient to wear CPAP for at least 4 hours each night for greater than 70% of the time to avoid the machine being repossessed by insurance.  Tobacco abuse Patient is an active smoker. Motivator would be able to get back surgery We discussed smoking cessation for 3 minutes. We discussed triggers and stressors and ways to deal with them. We discussed barriers to continued smoking and benefits of smoking cessation. Provided patient with information cessation techniques and interventions.

## 2022-11-08 NOTE — Telephone Encounter (Signed)
OV notes and clearance form have been faxed back to [facility]. Nothing further needed at this time.  °

## 2022-11-08 NOTE — Telephone Encounter (Signed)
Dr Loanne Drilling,  Is she clear for her surgery? You saw her but did not put anything about surgery in your office note.   Thank you

## 2022-11-08 NOTE — Telephone Encounter (Signed)
Yes it was discussed. I have updated my documentation. Patient low to intermediate risk for post-pulmonary complications. Discussed smoking cessation.

## 2022-12-20 ENCOUNTER — Other Ambulatory Visit: Payer: Self-pay | Admitting: Pulmonary Disease

## 2023-06-11 ENCOUNTER — Ambulatory Visit (HOSPITAL_COMMUNITY): Admission: RE | Admit: 2023-06-11 | Payer: Medicare Other | Source: Ambulatory Visit

## 2023-06-14 ENCOUNTER — Other Ambulatory Visit: Payer: Self-pay | Admitting: Cardiovascular Disease

## 2023-06-14 NOTE — Telephone Encounter (Signed)
Refill sent.

## 2023-06-14 NOTE — Telephone Encounter (Signed)
Yes please refill 

## 2023-07-24 ENCOUNTER — Other Ambulatory Visit (HOSPITAL_COMMUNITY): Payer: Self-pay | Admitting: Physician Assistant

## 2023-07-24 ENCOUNTER — Ambulatory Visit (HOSPITAL_COMMUNITY)
Admission: RE | Admit: 2023-07-24 | Discharge: 2023-07-24 | Disposition: A | Discharge status: Home / Self Care | Payer: Medicare Other | Source: Ambulatory Visit | Attending: Physician Assistant | Admitting: Physician Assistant

## 2023-07-24 DIAGNOSIS — J189 Pneumonia, unspecified organism: Secondary | ICD-10-CM | POA: Insufficient documentation

## 2023-09-13 ENCOUNTER — Ambulatory Visit (HOSPITAL_BASED_OUTPATIENT_CLINIC_OR_DEPARTMENT_OTHER): Payer: Medicare Other | Admitting: Pulmonary Disease

## 2023-09-13 ENCOUNTER — Ambulatory Visit (HOSPITAL_BASED_OUTPATIENT_CLINIC_OR_DEPARTMENT_OTHER): Payer: Medicare Other

## 2023-09-13 ENCOUNTER — Encounter (HOSPITAL_BASED_OUTPATIENT_CLINIC_OR_DEPARTMENT_OTHER): Payer: Self-pay | Admitting: Pulmonary Disease

## 2023-09-13 VITALS — BP 108/68 | HR 105 | Resp 18 | Ht 64.0 in | Wt 212.0 lb

## 2023-09-13 DIAGNOSIS — G4733 Obstructive sleep apnea (adult) (pediatric): Secondary | ICD-10-CM | POA: Diagnosis not present

## 2023-09-13 DIAGNOSIS — J189 Pneumonia, unspecified organism: Secondary | ICD-10-CM

## 2023-09-13 DIAGNOSIS — J4489 Other specified chronic obstructive pulmonary disease: Secondary | ICD-10-CM | POA: Diagnosis not present

## 2023-09-13 DIAGNOSIS — F17200 Nicotine dependence, unspecified, uncomplicated: Secondary | ICD-10-CM | POA: Diagnosis not present

## 2023-09-13 NOTE — Progress Notes (Signed)
Subjective:   PATIENT ID: Christina Whitehead GENDER: female DOB: [birth year], MRN: [id number]   HPI  Chief Complaint  Patient presents with   Follow-up    Doesn't feel advair is helping    Reason for Visit: Follow-up  [name] is a 68 year old female active smoker with COPD-asthma overlap, OSA, HTN and DM2 who presents for follow-up OSA and COPD-asthma follow-up.  08/02/22 She is compliant with her CPAP and Advair twice a day however continues to have wheezing at night. She does feel her symptoms are improved but does have shortness of breath and wheeze with exertion. Cough unchanged that is productive. Continues to smoke 1.5 ppd.  11/08/22 She recent had an upper viral respiratory (flu negative) earlier this month, recent sick contact with RSV. Testing was neg but likely caught it from granddaughter. Has cough, shortness of breath or wheezing that remains persistent. PCP treated with depo medrol shot and atrovent nebulizer and azithromycin. She is compliant with Advair 250. Has not picked up the 500 strength yet due to the cost. Reduced smoking from 1.5 ppd > 1 ppd. Compliant with CPAP and wears >4 hours nightly.  09/13/23 Since our last visit she has been less active due to her neuropathy. Her breathing comes and goes and was recently treated for pneumonia in December with levaquin and steroids. Improved coughing and wheezing. Doing well with CPAP and improved quality of sleep. She is not consistent with her Advair using it 2-3 times a week. Rarely using albuterol. Continues to smokes 1 ppd. Has productive cough, shortness of breath and wheezing  Social History: Active smoker. Started at 14. 60 pack-years. Previously smoking 1.5 ppd. 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)   Past Medical History:  Diagnosis Date   Anxiety    Diabetes mellitus without complication ([facility])    Hypertension    Neuropathy      Family History  Problem Relation Age of Onset   Stroke Mother     Heart disease Mother    Renal cancer Mother    Breast cancer Maternal Aunt      Social History   Occupational History   Not on file  Tobacco Use   Smoking status: Every Day    Current packs/day: 3.00    Average packs/day: 3.0 packs/day for 54.1 years (162.2 ttl pk-yrs)    Types: Cigarettes    Start date: 64   Smokeless tobacco: Never   Tobacco comments:    Currently smoking 1-1.5ppd as of 02/01/22 ep  Vaping Use   Vaping status: Never Used  Substance and Sexual Activity   Alcohol use: No   Drug use: No   Sexual activity: Not on file    Allergies  Allergen Reactions   Lisinopril Rash and Swelling    Diffuse rash. No throat swelling   Penicillins Hives    Rash Has patient had a PCN reaction causing immediate rash, facial/tongue/throat swelling, SOB or lightheadedness with hypotension: YES Has patient had a PCN reaction causing severe rash involving mucus membranes or skin necrosis: NO Has patient had a PCN reaction that required hospitalization: NO Has patient had a PCN reaction occurring within the last 10 years: NO} If all of the above answers are "NO", then may proceed with Cephalosporin use.      Outpatient Medications Prior to Visit  Medication Sig Dispense Refill   albuterol (VENTOLIN HFA) 108 (90 Base) MCG/ACT inhaler INHALE TWO PUFFS BY MOUTH INTO LUNGS EVERY 6 HOURS AS  NEEDED FOR WHEEZING/SHORTNESS OF BREATH 18 g 3   aspirin EC 81 MG tablet Take 81 mg by mouth daily. Swallow whole.     calcium carbonate (OSCAL) 1500 (600 Ca) MG TABS tablet Take by mouth.     clonazePAM (KLONOPIN) 0.5 MG tablet Take 0.5 mg by mouth 2 (two) times daily.     escitalopram (LEXAPRO) 20 MG tablet Take 20 mg by mouth daily.     fluticasone-salmeterol (ADVAIR DISKUS) 500-50 MCG/ACT AEPB Inhale 1 puff into the lungs in the morning and at bedtime. 60 each 5   hydrochlorothiazide (HYDRODIURIL) 25 MG tablet Take 1 tablet (25 mg total) by mouth daily. (Patient taking differently: Take  12.5 mg by mouth daily.) 90 tablet 3   metFORMIN (GLUCOPHAGE) 1000 MG tablet Take 1,000 mg by mouth 2 (two) times daily with a meal.     metoprolol tartrate (LOPRESSOR) 100 MG tablet Take one tablet two hours prior to the test 1 tablet 0   pregabalin (LYRICA) 300 MG capsule Take 1 capsule by mouth twice a day as directed by physician.     rosuvastatin (CRESTOR) 10 MG tablet TAKE 1 TABLET BY MOUTH DAILY 90 tablet 3   Semaglutide,0.25 or 0.5MG /DOS, (OZEMPIC, 0.25 OR 0.5 MG/DOSE,) 2 MG/3ML SOPN Inject into the skin.     vitamin B-12 (CYANOCOBALAMIN) 100 MCG tablet      ascorbic acid (VITAMIN C) 500 MG tablet Take 500 mg by mouth daily.     busPIRone (BUSPAR) 10 MG tablet Take 10 mg by mouth 2 (two) times daily.     cholecalciferol (VITAMIN D3) 25 MCG (1000 UNIT) tablet Take 1,000 Units by mouth daily.     gabapentin (NEURONTIN) 300 MG capsule TAKE THREE CAPSULES BY MOUTH THREE TIMES A DAY     No facility-administered medications prior to visit.    Review of Systems  Constitutional:  Negative for chills, diaphoresis, fever, malaise/fatigue and weight loss.  HENT:  Negative for congestion.   Respiratory:  Positive for cough, sputum production, shortness of breath and wheezing. Negative for hemoptysis.   Cardiovascular:  Negative for chest pain, palpitations and leg swelling.     Objective:   Vitals:   09/13/23 1533  BP: 108/68  Pulse: (!) 105  Resp: 18  SpO2: 95%  Weight: 212 lb (96.2 kg)  Height: 5\' 4"  (1.626 m)   SpO2: 95 %  Body mass index is 36.39 kg/m.  Physical Exam: General: Well-appearing, no acute distress HENT: [place], AT Eyes: EOMI, no scleral icterus Respiratory: Clear to auscultation bilaterally.  No crackles, wheezing or rales Cardiovascular: RRR, -M/R/G, no JVD Extremities:-Edema,-tenderness Neuro: AAO x4, CNII-XII grossly intact Psych: Normal mood, normal affect   Data Reviewed:  Imaging: CXR 06/03/12 - No acute cardiopulmonary disease CT Coronary 06/15/22  - Visualized lung parenchyma with no pulmonary nodules, masses, infiltrate, effusion or pneumothorax. CXR 07/24/23 - mild patchy bilateral lung opacities concerning for multifocal pneumonia  PFT: 11/23/2021 FVC 1.97 (62%) FEV1 1.61 (67%) ratio 91 TLC 83% DLCO 85% Interpretation: Reduced FEV1 and FVC with significant bronchodilator response present.  No restrictive defect and normal gas exchange.  Study suggestive of asthma  Sleep: Split night 11/03/21 - AHI 26.7 and nadir SpO2 85%. CPAP 12 cm H20 recommended.  Labs: Absolute eos 03/18/21 - 200      Latest Ref Rng & Units 06/04/2012    4:43 AM 06/03/2012   10:40 AM  CBC  WBC 4.0 - 10.5 K/uL 6.9  6.3   Hemoglobin 12.0 -  15.0 g/dL 81.1  91.4   Hematocrit 36.0 - 46.0 % 45.6  44.7   Platelets 150 - 400 K/uL 236  230    Labs 10/04/22 [facility] WBC 9.5 Hg 15.5 Plt 231 CMET overall normal. Albumin 4.4 normal   CPAP Compliant 09/08/22-11/06/22 Usage days 60/60 days (100%) >4 hours 54 days (90%) CPAP 12 cm H20 AHI 0.8  CPAP compliance report 08/14/23-09/12/23 Usage days 30/30 days (100%) >4 hours 28 days (93%) Avg hours 5 hour 29 min CPAP 12 cm H20 EPR 2 AHI 0.3  Assessment & Plan:   Discussion: 68 year old female active smoker with COPD-asthma overlap, OSA, HTN, DME who presents for follow-up for COPD/asthma. Improving respiratory symptoms after pneumonia. With persistent symptoms not adherent of current bronchodilators. Remains active smoker. Reviewed CPAP compliance report and well controlled OSA on CPAP.  Moderate persistent asthma +/- COPD - symptomatic  Recent pneumonia --Counseled on inhaler compliance --RESTART Advair 500-50 mcg ONE puff TWICE a day --CONTINUE Albuterol AS NEEDED for shortness of breath or wheezing --CXR for resolution of pneumonia  Moderate OSA Patient uses NIV for more than four hours nightly for at least 70% of nights during the last three months of usage. The patient has been using and benefiting  from PAP use and will continue to benefit from therapy.  --Counseled on sleep hygiene --Counseled on weight loss/maintenance of healthy weight. Working with PCP --Counseled NOT to drive if/when sleepy --Advised patient to wear CPAP for at least 4 hours each night for greater than 70% of the time to avoid the machine being repossessed by insurance.  Tobacco abuse Patient is an active smoker. We discussed smoking cessation for <3 minutes. We discussed triggers and stressors and ways to deal with them. We discussed barriers to continued smoking and benefits of smoking cessation. Provided patient with information cessation techniques and interventions including [place] quitline. --Did not tolerate wellbutrin due to tremors   Health Maintenance Immunization History  Administered Date(s) Administered   Influenza Inj Mdck Quad Pf 05/05/2022   Moderna Sars-Covid-2 Vaccination 10/29/2019, 11/26/2019, 07/12/2020   CT Lung Screen - Scheduled  Orders Placed This Encounter  Procedures   DG Chest 2 View    Standing Status:   Future    Expiration Date:   09/12/2024    Reason for Exam (SYMPTOM  OR DIAGNOSIS REQUIRED):   pneumonia    Preferred imaging location?:   [facility]   No orders of the defined types were placed in this encounter.   Return in about 3 months (around 12/12/2023).  I have spent a total time of 30-minutes on the day of the appointment including chart review, data review, collecting history, coordinating care and discussing medical diagnosis and plan with the patient/family. Past medical history, allergies, medications were reviewed. Pertinent imaging, labs and tests included in this note have been reviewed and interpreted independently by me.  Kanon Novosel Mechele Collin, MD [facility] 09/13/2023 3:49 PM

## 2023-09-13 NOTE — Patient Instructions (Signed)
Moderate persistent asthma +/- COPD - symptomatic  Recent pneumonia --Counseled on inhaler compliance --RESTART Advair 500-50 mcg ONE puff TWICE a day --CONTINUE Albuterol AS NEEDED for shortness of breath or wheezing --CXR for resolution of pneumonia  Moderate OSA Patient uses NIV for more than four hours nightly for at least 70% of nights during the last three months of usage. The patient has been using and benefiting from PAP use and will continue to benefit from therapy.  --Counseled on sleep hygiene --Counseled on weight loss/maintenance of healthy weight. Working with PCP --Counseled NOT to drive if/when sleepy --Advised patient to wear CPAP for at least 4 hours each night for greater than 70% of the time to avoid the machine being repossessed by insurance.  Tobacco abuse Patient is an active smoker. We discussed smoking cessation for <3 minutes. We discussed triggers and stressors and ways to deal with them. We discussed barriers to continued smoking and benefits of smoking cessation. Provided patient with information cessation techniques and interventions including [place] quitline. --Did not tolerate wellbutrin due to tremors

## 2023-09-19 ENCOUNTER — Encounter (HOSPITAL_BASED_OUTPATIENT_CLINIC_OR_DEPARTMENT_OTHER): Payer: Self-pay | Admitting: Pulmonary Disease

## 2023-09-20 ENCOUNTER — Other Ambulatory Visit (HOSPITAL_COMMUNITY): Payer: Self-pay | Admitting: Pain Medicine

## 2023-09-20 DIAGNOSIS — M5416 Radiculopathy, lumbar region: Secondary | ICD-10-CM

## 2023-09-24 ENCOUNTER — Ambulatory Visit (HOSPITAL_COMMUNITY)
Admission: RE | Admit: 2023-09-24 | Discharge: 2023-09-24 | Disposition: A | Discharge status: Home / Self Care | Payer: Medicare Other | Source: Ambulatory Visit | Attending: Pain Medicine | Admitting: Pain Medicine

## 2023-09-24 ENCOUNTER — Encounter (HOSPITAL_COMMUNITY): Payer: Self-pay

## 2023-09-24 DIAGNOSIS — M5416 Radiculopathy, lumbar region: Secondary | ICD-10-CM | POA: Diagnosis present

## 2023-11-27 ENCOUNTER — Encounter (HOSPITAL_BASED_OUTPATIENT_CLINIC_OR_DEPARTMENT_OTHER): Payer: Self-pay

## 2023-11-27 ENCOUNTER — Ambulatory Visit (HOSPITAL_BASED_OUTPATIENT_CLINIC_OR_DEPARTMENT_OTHER): Payer: Medicare Other | Admitting: Pulmonary Disease

## 2023-11-28 ENCOUNTER — Encounter (HOSPITAL_BASED_OUTPATIENT_CLINIC_OR_DEPARTMENT_OTHER): Payer: Self-pay | Admitting: Pulmonary Disease

## 2023-11-28 ENCOUNTER — Ambulatory Visit (HOSPITAL_BASED_OUTPATIENT_CLINIC_OR_DEPARTMENT_OTHER): Admitting: Pulmonary Disease

## 2023-11-28 VITALS — BP 124/80 | HR 87 | Ht 64.0 in | Wt 208.3 lb

## 2023-11-28 DIAGNOSIS — F17210 Nicotine dependence, cigarettes, uncomplicated: Secondary | ICD-10-CM | POA: Diagnosis not present

## 2023-11-28 DIAGNOSIS — J4489 Other specified chronic obstructive pulmonary disease: Secondary | ICD-10-CM | POA: Diagnosis not present

## 2023-11-28 DIAGNOSIS — G4733 Obstructive sleep apnea (adult) (pediatric): Secondary | ICD-10-CM

## 2023-11-28 DIAGNOSIS — Z720 Tobacco use: Secondary | ICD-10-CM

## 2023-11-28 MED ORDER — INCRUSE ELLIPTA 62.5 MCG/ACT IN AEPB: 1.0000 | INHALATION_SPRAY | Freq: Every day | RESPIRATORY_TRACT | 5 refills | Status: AC

## 2023-11-28 NOTE — Patient Instructions (Signed)
  Moderate persistent asthma +/- COPD - symptomatic  Recent pneumonia --CONTINUE Advair 500-50 mcg ONE puff TWICE a day --START Incruse ONE puff ONCE a day --CONTINUE Albuterol AS NEEDED for shortness of breath or wheezing  Moderate OSA --CONTINUE CPAP  Tobacco abuse --Continue to work on quitting

## 2023-11-28 NOTE — Progress Notes (Signed)
 Subjective:   PATIENT ID: Christina Whitehead GENDER: female DOB: 08-14-1956, MRN: [id number]   HPI  Chief Complaint  Patient presents with   Follow-up    Obstructive sleep apnea, pt states she is feeling well    Reason for Visit: Follow-up  [name] is a 68 year old female active smoker with COPD-asthma overlap, OSA, HTN and DM2 who presents for follow-up OSA and COPD-asthma follow-up.  08/02/22 She is compliant with her CPAP and Advair twice a day however continues to have wheezing at night. She does feel her symptoms are improved but does have shortness of breath and wheeze with exertion. Cough unchanged that is productive. Continues to smoke 1.5 ppd.  11/08/22 She recent had an upper viral respiratory (flu negative) earlier this month, recent sick contact with RSV. Testing was neg but likely caught it from granddaughter. Has cough, shortness of breath or wheezing that remains persistent. PCP treated with depo medrol  shot and atrovent nebulizer and azithromycin. She is compliant with Advair 250. Has not picked up the 500 strength yet due to the cost. Reduced smoking from 1.5 ppd > 1 ppd. Compliant with CPAP and wears >4 hours nightly.  09/13/23 Since our last visit she has been less active due to her neuropathy. Her breathing comes and goes and was recently treated for pneumonia in December with levaquin and steroids. Improved coughing and wheezing. Doing well with CPAP and improved quality of sleep. She is not consistent with her Advair using it 2-3 times a week. Rarely using albuterol . Continues to smokes 1 ppd. Has productive cough, shortness of breath and wheezing  11/28/23 Since our last visit she reports breathing overall ok. Shortness of breath and occasional wheezing with activity. Chronic cough. No exacerbations since our last visit. Has lost 27 lbs since starting ozempic. Has been compliant with CPAP. She continues to smoke 1.5 ppd. She is starting work at [employer].  Social History: Active smoker. Started at 14. 60 pack-years. Previously smoking 1.5 ppd. 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)   Past Medical History:  Diagnosis Date   Anxiety    Diabetes mellitus without complication (HCC)    Hypertension    Neuropathy      Family History  Problem Relation Age of Onset   Stroke Mother    Heart disease Mother    Renal cancer Mother    Breast cancer Maternal Aunt      Social History   Occupational History   Not on file  Tobacco Use   Smoking status: Every Day    Current packs/day: 3.00    Average packs/day: 3.0 packs/day for 54.3 years (162.8 ttl pk-yrs)    Types: Cigarettes    Start date: 37   Smokeless tobacco: Never   Tobacco comments:    Currently smoking 1-1.5ppd as of 02/01/22 ep  Vaping Use   Vaping status: Never Used  Substance and Sexual Activity   Alcohol use: No   Drug use: No   Sexual activity: Not on file    Allergies  Allergen Reactions   Lisinopril Rash and Swelling    Diffuse rash. No throat swelling   Penicillins Hives    Rash Has patient had a PCN reaction causing immediate rash, facial/tongue/throat swelling, SOB or lightheadedness with hypotension: YES Has patient had a PCN reaction causing severe rash involving mucus membranes or skin necrosis: NO Has patient had a PCN reaction that required hospitalization: NO Has patient had a PCN reaction occurring  within the last 10 years: NO} If all of the above answers are "NO", then may proceed with Cephalosporin use.      Outpatient Medications Prior to Visit  Medication Sig Dispense Refill   albuterol  (VENTOLIN  HFA) 108 (90 Base) MCG/ACT inhaler INHALE TWO PUFFS BY MOUTH INTO LUNGS EVERY 6 HOURS AS NEEDED FOR WHEEZING/SHORTNESS OF BREATH 18 g 3   aspirin  EC 81 MG tablet Take 81 mg by mouth daily. Swallow whole.     calcium  carbonate (OSCAL) 1500 (600 Ca) MG TABS tablet Take by mouth.     clonazePAM  (KLONOPIN ) 0.5 MG tablet Take 0.5 mg by mouth 2  (two) times daily.     escitalopram (LEXAPRO) 20 MG tablet Take 20 mg by mouth daily.     fluticasone -salmeterol (ADVAIR DISKUS) 500-50 MCG/ACT AEPB Inhale 1 puff into the lungs in the morning and at bedtime. 60 each 5   hydrochlorothiazide  (HYDRODIURIL ) 25 MG tablet Take 1 tablet (25 mg total) by mouth daily. (Patient taking differently: Take 12.5 mg by mouth daily.) 90 tablet 3   metFORMIN  (GLUCOPHAGE ) 1000 MG tablet Take 1,000 mg by mouth 2 (two) times daily with a meal.     pregabalin (LYRICA) 300 MG capsule Take 1 capsule by mouth twice a day as directed by physician.     rosuvastatin  (CRESTOR ) 10 MG tablet TAKE 1 TABLET BY MOUTH DAILY 90 tablet 3   Semaglutide,0.25 or 0.5MG /DOS, (OZEMPIC, 0.25 OR 0.5 MG/DOSE,) 2 MG/3ML SOPN Inject into the skin.     vitamin B-12 (CYANOCOBALAMIN) 100 MCG tablet      metoprolol  tartrate (LOPRESSOR ) 100 MG tablet Take one tablet two hours prior to the test (Patient not taking: Reported on 11/28/2023) 1 tablet 0   No facility-administered medications prior to visit.    Review of Systems  Constitutional:  Negative for chills, diaphoresis, fever, malaise/fatigue and weight loss.  HENT:  Negative for congestion.   Respiratory:  Positive for cough, sputum production, shortness of breath and wheezing. Negative for hemoptysis.   Cardiovascular:  Negative for chest pain, palpitations and leg swelling.     Objective:   Vitals:   11/28/23 1404  BP: 124/80  Pulse: 87  SpO2: 95%  Weight: 208 lb 5 oz (94.5 kg)  Height: 5\' 4"  (1.626 m)   SpO2: 95 %  Body mass index is 35.76 kg/m.  Physical Exam: General: Well-appearing, no acute distress HENT: [place], AT Eyes: EOMI, no scleral icterus Respiratory: Clear to auscultation bilaterally.  No crackles, wheezing or rales Cardiovascular: RRR, -M/R/G, no JVD Extremities:-Edema,-tenderness Neuro: AAO x4, CNII-XII grossly intact Psych: Normal mood, normal affect   Data Reviewed:  Imaging: CXR 06/03/12 - No  acute cardiopulmonary disease CT Coronary 06/15/22 - Visualized lung parenchyma with no pulmonary nodules, masses, infiltrate, effusion or pneumothorax. CXR 07/24/23 - mild patchy bilateral lung opacities concerning for multifocal pneumonia CXR 09/13/23 - improved infiltrates with no residual pneumonia  PFT: 11/23/2021 FVC 1.97 (62%) FEV1 1.61 (67%) ratio 91 TLC 83% DLCO 85% Interpretation: Reduced FEV1 and FVC with significant bronchodilator response present.  No restrictive defect and normal gas exchange.  Study suggestive of asthma  Sleep: Split night 11/03/21 - AHI 26.7 and nadir SpO2 85%. CPAP 12 cm H20 recommended.  Labs: Absolute eos 03/18/21 - 200      Latest Ref Rng & Units 06/04/2012    4:43 AM 06/03/2012   10:40 AM  CBC  WBC 4.0 - 10.5 K/uL 6.9  6.3   Hemoglobin 12.0 - 15.0 g/dL  15.5  15.5   Hematocrit 36.0 - 46.0 % 45.6  44.7   Platelets 150 - 400 K/uL 236  230    Labs 10/04/22 [facility] WBC 9.5 Hg 15.5 Plt 231 CMET overall normal. Albumin 4.4 normal   CPAP Compliant 09/08/22-11/06/22 Usage days 60/60 days (100%) >4 hours 54 days (90%) CPAP 12 cm H20 AHI 0.8  CPAP compliance report 08/14/23-09/12/23 Usage days 30/30 days (100%) >4 hours 28 days (93%) Avg hours 5 hour 29 min CPAP 12 cm H20 EPR 2 AHI 0.3  CPAP compliance report 10/28/23-11/26/23 Usage days 30/30 days (100%) >4 hours 27 days (90%) Avg hours 5 hour 50 min CPAP 12 cm H20 EPR 2 AHI 0.2  Assessment & Plan:   Discussion: 68 year old female active smoker with COPD-asthma overlap, OSA, HTN who presents for follow-up for COPD/asthma. Stable symptoms with breakthrough during activity. Counseled on smoking cessation. Discussed clinical course and management of COPD/asthma including bronchodilator regimen, preventive care  and action plan for exacerbation.  Moderate persistent asthma +/- COPD - symptomatic  --CONTINUE Advair 500-50 mcg ONE puff TWICE a day --START Incruse ONE puff ONCE a day --CONTINUE  Albuterol  AS NEEDED for shortness of breath or wheezing  Moderate OSA Patient uses NIV for more than four hours nightly for at least 70% of nights during the last three months of usage. The patient has been using and benefiting from PAP use and will continue to benefit from therapy.  --Counseled on sleep hygiene --Counseled on weight loss/maintenance of healthy weight. Working with PCP. On Ozempic --Counseled NOT to drive if/when sleepy --Advised patient to wear CPAP for at least 4 hours each night for greater than 70% of the time to avoid the machine being repossessed by insurance.  Tobacco abuse Patient is an active smoker. We discussed smoking cessation for <3 minutes. We discussed triggers and stressors and ways to deal with them. We discussed barriers to continued smoking and benefits of smoking cessation. Provided patient with information cessation techniques and interventions including [place] quitline. --Did not tolerate wellbutrin due to tremors   Health Maintenance Immunization History  Administered Date(s) Administered   Influenza Inj Mdck Quad Pf 05/05/2022   Moderna Sars-Covid-2 Vaccination 10/29/2019, 11/26/2019, 07/12/2020   CT Lung Screen - Scheduled  No orders of the defined types were placed in this encounter.  Meds ordered this encounter  Medications   umeclidinium bromide  (INCRUSE ELLIPTA ) 62.5 MCG/ACT AEPB    Sig: Inhale 1 puff into the lungs daily.    Dispense:  30 each    Refill:  5    Return in about 6 months (around 05/29/2024).  I have spent a total time of 32-minutes on the day of the appointment including chart review, data review, collecting history, coordinating care and discussing medical diagnosis and plan with the patient/family. Past medical history, allergies, medications were reviewed. Pertinent imaging, labs and tests included in this note have been reviewed and interpreted independently by me.  Hardie Veltre Genetta Kenning, MD [facility] 11/28/2023 2:31 PM

## 2024-01-01 ENCOUNTER — Other Ambulatory Visit: Payer: Self-pay | Admitting: Orthopaedic Surgery

## 2024-01-01 DIAGNOSIS — G8929 Other chronic pain: Secondary | ICD-10-CM

## 2024-01-04 ENCOUNTER — Ambulatory Visit
Admission: RE | Admit: 2024-01-04 | Discharge: 2024-01-04 | Disposition: A | Discharge status: Home / Self Care | Source: Ambulatory Visit | Attending: Orthopaedic Surgery | Admitting: Orthopaedic Surgery

## 2024-01-04 DIAGNOSIS — G8929 Other chronic pain: Secondary | ICD-10-CM

## 2024-01-21 ENCOUNTER — Encounter (HOSPITAL_COMMUNITY): Payer: Self-pay | Admitting: *Deleted

## 2024-01-21 ENCOUNTER — Emergency Department (HOSPITAL_COMMUNITY)
Admission: EM | Admit: 2024-01-21 | Discharge: 2024-01-21 | Disposition: A | Discharge status: Home / Self Care | Attending: Emergency Medicine | Admitting: Emergency Medicine

## 2024-01-21 ENCOUNTER — Emergency Department (HOSPITAL_COMMUNITY)

## 2024-01-21 ENCOUNTER — Other Ambulatory Visit: Payer: Self-pay

## 2024-01-21 DIAGNOSIS — Z7982 Long term (current) use of aspirin: Secondary | ICD-10-CM | POA: Insufficient documentation

## 2024-01-21 DIAGNOSIS — K644 Residual hemorrhoidal skin tags: Secondary | ICD-10-CM | POA: Insufficient documentation

## 2024-01-21 DIAGNOSIS — K649 Unspecified hemorrhoids: Secondary | ICD-10-CM

## 2024-01-21 DIAGNOSIS — K5900 Constipation, unspecified: Secondary | ICD-10-CM | POA: Diagnosis present

## 2024-01-21 LAB — COMPREHENSIVE METABOLIC PANEL WITH GFR
ALT: 11 U/L (ref 0–44)
AST: 16 U/L (ref 15–41)
Albumin: 3.9 g/dL (ref 3.5–5.0)
Alkaline Phosphatase: 51 U/L (ref 38–126)
Anion gap: 13 (ref 5–15)
BUN: 10 mg/dL (ref 8–23)
CO2: 27 mmol/L (ref 22–32)
Calcium: 9.4 mg/dL (ref 8.9–10.3)
Chloride: 97 mmol/L — ABNORMAL LOW (ref 98–111)
Creatinine, Ser: 0.41 mg/dL — ABNORMAL LOW (ref 0.44–1.00)
GFR, Estimated: >60 mL/min (ref >60)
Glucose, Bld: 88 mg/dL (ref 70–99)
Potassium: 3.4 mmol/L — ABNORMAL LOW (ref 3.5–5.1)
Sodium: 137 mmol/L (ref 135–145)
Total Bilirubin: 1.7 mg/dL — ABNORMAL HIGH (ref 0.0–1.2)
Total Protein: 6.8 g/dL (ref 6.5–8.1)

## 2024-01-21 LAB — CBC WITH DIFFERENTIAL/PLATELET
Abs Immature Granulocytes: 0.03 10*3/uL (ref 0.00–0.07)
Basophils Absolute: 0.1 10*3/uL (ref 0.0–0.1)
Basophils Relative: 1 %
Eosinophils Absolute: 0.1 10*3/uL (ref 0.0–0.5)
Eosinophils Relative: 1 %
HCT: 46.9 % — ABNORMAL HIGH (ref 36.0–46.0)
Hemoglobin: 16.3 g/dL — ABNORMAL HIGH (ref 12.0–15.0)
Immature Granulocytes: 0 %
Lymphocytes Relative: 28 %
Lymphs Abs: 2.9 10*3/uL (ref 0.7–4.0)
MCH: 32.5 pg (ref 26.0–34.0)
MCHC: 34.8 g/dL (ref 30.0–36.0)
MCV: 93.6 fL (ref 80.0–100.0)
Monocytes Absolute: 0.9 10*3/uL (ref 0.1–1.0)
Monocytes Relative: 9 %
Neutro Abs: 6.3 10*3/uL (ref 1.7–7.7)
Neutrophils Relative %: 61 %
Platelets: 260 10*3/uL (ref 150–400)
RBC: 5.01 MIL/uL (ref 3.87–5.11)
RDW: 14.1 % (ref 11.5–15.5)
WBC: 10.2 10*3/uL (ref 4.0–10.5)
nRBC: 0 % (ref 0.0–0.2)

## 2024-01-21 LAB — LIPASE, BLOOD: Lipase: 38 U/L (ref 11–51)

## 2024-01-21 MED ORDER — POLYETHYLENE GLYCOL 3350 17 G PO PACK: 17.0000 g | PACK | Freq: Every day | ORAL | 0 refills | Status: AC

## 2024-01-21 MED ORDER — METOCLOPRAMIDE HCL 10 MG PO TABS: 10.0000 mg | ORAL_TABLET | Freq: Four times a day (QID) | ORAL | 0 refills | Status: DC

## 2024-01-21 MED ORDER — DOCUSATE SODIUM 100 MG PO CAPS: 100.0000 mg | ORAL_CAPSULE | Freq: Two times a day (BID) | ORAL | 0 refills | Status: AC

## 2024-01-21 MED ORDER — LACTATED RINGERS IV BOLUS
1000.0000 mL | Freq: Once | INTRAVENOUS | Status: AC
  Administered 2024-01-21: 1000 mL via INTRAVENOUS

## 2024-01-21 MED ORDER — HYDROCORTISONE (PERIANAL) 2.5 % EX CREA: 1.0000 | TOPICAL_CREAM | Freq: Two times a day (BID) | CUTANEOUS | 0 refills | Status: AC

## 2024-01-21 NOTE — ED Provider Notes (Signed)
 [facility] EMERGENCY DEPARTMENT AT [facility] Provider Note   CSN: Whitehead Arrival date & time: 01/21/24  1521     History  Chief Complaint  Patient presents with   Constipation    Christina Whitehead is a 68 y.o. female.  Patient is a 68 year old female who presents emergency department the chief complaint of constipation.  Patient notes that she did attempt to disimpact herself earlier today and notes that she did get some blood on her finger after that.  She notes that she was able to get a small amount of stool out.  She notes that she is currently on Ozempic and did recently increase her dose.  Patient notes that she has had some associated nausea without vomiting.  She does admit to poor p.o. intake since the increase of her Ozempic.  She denies any gross melena or hematochezia.  She denies any fever, chills, chest pain, shortness of breath.  She denies any abdominal pain.   Constipation      Home Medications Prior to Admission medications   Medication Sig Start Date End Date Taking? Authorizing Provider  albuterol  (VENTOLIN  HFA) 108 (90 Base) MCG/ACT inhaler INHALE TWO PUFFS BY MOUTH INTO LUNGS EVERY 6 HOURS AS NEEDED FOR WHEEZING/SHORTNESS OF BREATH 12/25/22   Quillian Brunt, MD  aspirin  EC 81 MG tablet Take 81 mg by mouth daily. Swallow whole.    [provider]  calcium  carbonate (OSCAL) 1500 (600 Ca) MG TABS tablet Take by mouth.    [provider]  clonazePAM  (KLONOPIN ) 0.5 MG tablet Take 0.5 mg by mouth 2 (two) times daily. 09/01/22   [provider]  escitalopram (LEXAPRO) 20 MG tablet Take 20 mg by mouth daily. 03/01/21   [provider]  fluticasone -salmeterol (ADVAIR DISKUS) 500-50 MCG/ACT AEPB Inhale 1 puff into the lungs in the morning and at bedtime. 11/08/22   Quillian Brunt, MD  hydrochlorothiazide  (HYDRODIURIL ) 25 MG tablet Take 1 tablet (25 mg total) by mouth daily. Patient taking differently: Take 12.5 mg by  mouth daily. 06/26/12   Flo Hummingbird, PA-C  metFORMIN  (GLUCOPHAGE ) 1000 MG tablet Take 1,000 mg by mouth 2 (two) times daily with a meal.    [provider]  metoprolol  tartrate (LOPRESSOR ) 100 MG tablet Take one tablet two hours prior to the test Patient not taking: Reported on 11/28/2023 05/31/22   Croitoru, Mihai, MD  pregabalin (LYRICA) 300 MG capsule Take 1 capsule by mouth twice a day as directed by physician. 09/23/21   [provider]  rosuvastatin  (CRESTOR ) 10 MG tablet TAKE 1 TABLET BY MOUTH DAILY 06/14/23   Croitoru, Mihai, MD  Semaglutide,0.25 or 0.5MG /DOS, (OZEMPIC, 0.25 OR 0.5 MG/DOSE,) 2 MG/3ML SOPN Inject into the skin.    [provider]  umeclidinium bromide  (INCRUSE ELLIPTA ) 62.5 MCG/ACT AEPB Inhale 1 puff into the lungs daily. 11/28/23   Quillian Brunt, MD  vitamin B-12 (CYANOCOBALAMIN) 100 MCG tablet  03/01/21   [provider]      Allergies    Lisinopril and Penicillins    Review of Systems   Review of Systems  Gastrointestinal:  Positive for constipation.  All other systems reviewed and are negative.   Physical Exam Updated Vital Signs BP 136/80   Pulse 78   Temp 98.4 F (36.9 C) (Oral)   Resp 18   Ht 5\' 4"  (1.626 m)   Wt 90.7 kg   SpO2 95%   BMI 34.33 kg/m  Physical Exam Vitals  and nursing note reviewed. Exam conducted with a chaperone present.  Constitutional:      Appearance: Normal appearance.  HENT:     Head: Normocephalic and atraumatic.     Nose: Nose normal.     Mouth/Throat:     Mouth: Mucous membranes are moist.  Eyes:     Extraocular Movements: Extraocular movements intact.     Conjunctiva/sclera: Conjunctivae normal.     Pupils: Pupils are equal, round, and reactive to light.  Cardiovascular:     Rate and Rhythm: Normal rate and regular rhythm.     Pulses: Normal pulses.     Heart sounds: Normal heart sounds. No murmur heard.    No gallop.  Pulmonary:     Effort: Pulmonary effort is normal. No  respiratory distress.     Breath sounds: Normal breath sounds. No stridor. No wheezing, rhonchi or rales.  Abdominal:     General: Abdomen is flat. Bowel sounds are normal. There is no distension.     Palpations: Abdomen is soft.     Tenderness: There is no abdominal tenderness. There is no guarding.  Genitourinary:    Comments: Rectal exam demonstrates external and internal hemorrhoids, no active bleeding, no stool within the rectal vault Musculoskeletal:        General: Normal range of motion.     Cervical back: Normal range of motion and neck supple.     Right lower leg: No edema.     Left lower leg: No edema.  Skin:    General: Skin is warm and dry.  Neurological:     General: No focal deficit present.     Mental Status: She is alert and oriented to person, place, and time. Mental status is at baseline.  Psychiatric:        Mood and Affect: Mood normal.        Behavior: Behavior normal.        Thought Content: Thought content normal.        Judgment: Judgment normal.     ED Results / Procedures / Treatments   Labs (all labs ordered are listed, but only abnormal results are displayed) Labs Reviewed  COMPREHENSIVE METABOLIC PANEL WITH GFR - Abnormal; Notable for the following components:      Result Value   Potassium 3.4 (*)    Chloride 97 (*)    Creatinine, Ser 0.41 (*)    Total Bilirubin 1.7 (*)    All other components within normal limits  CBC WITH DIFFERENTIAL/PLATELET - Abnormal; Notable for the following components:   Hemoglobin 16.3 (*)    HCT 46.9 (*)    All other components within normal limits  LIPASE, BLOOD    EKG None  Radiology DG Abdomen 1 View Result Date: 01/21/2024 CLINICAL DATA:  Constipation, nausea EXAM: ABDOMEN - 1 VIEW COMPARISON:  None Available. FINDINGS: Nonobstructive bowel gas pattern. Normal colonic stool burden. Mild degenerative changes of the lumbar spine. Thoracic spine stimulator. IMPRESSION: Negative.  Normal colonic stool burden.  Electronically Signed   By: Zadie Herter M.D.   On: 01/21/2024 20:31    Procedures Procedures    Medications Ordered in ED Medications  lactated ringers bolus 1,000 mL (1,000 mLs Intravenous New Bag/Given 01/21/24 2024)    ED Course/ Medical Decision Making/ A&P                                 Medical Decision Making Patient  is doing well at this time and is stable for discharge home.  Discussed with patient that x-ray demonstrated no indication for overt constipation.  Physical exam performed with chaperone demonstrated external and internal hemorrhoids and do suspect that this was the cause of her bleeding.  There was no bleeding noted on exam.  There was no stool noted within the rectal vault and no indication for disimpaction at this point.  Patient's abdominal exam is benign with no focal tenderness throughout.  Do not suspect an acute intra-abdominal surgical process such as acute appendicitis, cholecystitis, bowel torsion, diverticulitis, ovarian torsion cyst, pyelonephritis, kidney stone, pancreatitis, mesenteric ischemia.  Blood work was otherwise unremarkable.  Will continue symptomatic treatment on outpatient basis.  Recommended close follow-up with primary care doctor as well as strict turn precautions for any new or worsening symptoms.  Patient voiced understanding to the plan and had no additional questions.  Amount and/or Complexity of Data Reviewed Labs: ordered. Radiology: ordered.  Risk OTC drugs. Prescription drug management.           Final Clinical Impression(s) / ED Diagnoses Final diagnoses:  None    Rx / DC Orders ED Discharge Orders     None         Emmalene Hare 01/21/24 2152    Dorenda Gandy, MD 01/21/24 2210

## 2024-01-21 NOTE — Discharge Instructions (Signed)
 Please follow-up closely with your primary care doctor on an outpatient basis.  Return to emergency department immediately for any new or worsening symptoms.

## 2024-01-21 NOTE — ED Triage Notes (Signed)
 Pt states with constipation x 1 week, able to pass some stool yesterday.  Pt notes some blood on toilet paper with wiping.

## 2024-02-08 ENCOUNTER — Telehealth: Payer: Self-pay

## 2024-02-08 NOTE — Telephone Encounter (Signed)
 Left message for the pt to call the office to schedule an in office appt for preop clearance.

## 2024-02-08 NOTE — Telephone Encounter (Signed)
   Pre-operative Risk Assessment    Patient Name: Christina Whitehead  DOB: April 23, 1956 MRN: [id number]   Date of last office visit: 05/31/22 Luana Rumple, MD Date of next office visit: NONE   Request for Surgical Clearance    Procedure:  RIGHT REVERSE TOTAL SHOULDER ARTHROPLASTY  Date of Surgery:  Clearance TBD                                Surgeon:  Grafton Lawrence, MD Surgeon's Group or Practice Name:  [facility] Phone number:  [phone]   EXT 3132 Fax number:  [phone]  ATTN: Mark Sil   Type of Clearance Requested:   - Medical  - Pharmacy:  Hold Aspirin      Type of Anesthesia:  General  W/ INTERSCALENE BLOCK   Additional requests/questions:    SignedCollin Deal   02/08/2024, 10:09 AM

## 2024-02-08 NOTE — Telephone Encounter (Signed)
   Name: Christina Whitehead  DOB: 1956/06/23  MRN: [id number]  Primary Cardiologist: Luana Rumple, MD  Chart reviewed as part of pre-operative protocol coverage. Because of Christina Whitehead's past medical history and time since last visit, she will require a follow-up in-office visit in order to better assess preoperative cardiovascular risk.Last seen by Marlana Silvan, DNP on 10/12/2022.   Pre-op covering staff: - Please schedule appointment and call patient to inform them. If patient already had an upcoming appointment within acceptable timeframe, please add pre-op clearance to the appointment notes so provider is aware. - Please contact requesting surgeon's office via preferred method (i.e, phone, fax) to inform them of need for appointment prior to surgery.    Friddie Jetty, NP  02/08/2024, 10:34 AM

## 2024-02-11 ENCOUNTER — Telehealth: Payer: Self-pay | Admitting: Pulmonary Disease

## 2024-02-11 NOTE — Telephone Encounter (Signed)
 2nd attempt to reach pt regarding the need for an IN OFFICE appointment for surgical clearance.  Left pt a message to call back and get that scheduled.

## 2024-02-11 NOTE — Telephone Encounter (Signed)
 Fax received from Dr. Cristy with [facility]head to perform a  right reverse total shoulder arthroplasty with interscalene block  Patient needs surgery clearance. Surgery is pending. Patient was seen on 11/28/23. Office protocol is a risk assessment can be sent to surgeon if patient has been seen in 60 days or less.

## 2024-02-12 NOTE — Telephone Encounter (Signed)
 Patient is returning call. She says she hasn't received the calls on her phone and would like to have pre-op try calling her again at [phone], and if no answer please call her husband's phone at [phone] if at all possible.

## 2024-02-12 NOTE — Telephone Encounter (Signed)
 3rd attempt to reach pt to schedule and appt in office for preop clearance. Pt was last seen in the office 10/12/22.   I will update the requesting office that we have not been able to reach the. Mail box is full and we cannot leave a message to call back to schedule an appt in office for preop clearance.   I will removed from the preop call back pool. Pt needs to call back and let the operator know she needs to schedule in office appt for preop clearance.

## 2024-02-12 NOTE — Telephone Encounter (Signed)
 Pt has been scheduled in office appt 02/19/24 with Lum Louis, NP for preop clearance. I will update all parties involved pt has appt.

## 2024-02-15 NOTE — Telephone Encounter (Signed)
 Called the pt to get her scheduled for appt for risk assessment and there was no answer- LMTCB.

## 2024-02-19 ENCOUNTER — Ambulatory Visit: Attending: Emergency Medicine | Admitting: Emergency Medicine

## 2024-02-19 ENCOUNTER — Encounter: Payer: Self-pay | Admitting: Emergency Medicine

## 2024-02-19 VITALS — BP 108/72 | HR 74 | Ht 65.0 in | Wt 198.8 lb

## 2024-02-19 DIAGNOSIS — E785 Hyperlipidemia, unspecified: Secondary | ICD-10-CM | POA: Insufficient documentation

## 2024-02-19 DIAGNOSIS — I10 Essential (primary) hypertension: Secondary | ICD-10-CM | POA: Insufficient documentation

## 2024-02-19 DIAGNOSIS — Z01810 Encounter for preprocedural cardiovascular examination: Secondary | ICD-10-CM | POA: Diagnosis not present

## 2024-02-19 DIAGNOSIS — I2510 Atherosclerotic heart disease of native coronary artery without angina pectoris: Secondary | ICD-10-CM | POA: Insufficient documentation

## 2024-02-19 DIAGNOSIS — J449 Chronic obstructive pulmonary disease, unspecified: Secondary | ICD-10-CM | POA: Diagnosis present

## 2024-02-19 DIAGNOSIS — E1143 Type 2 diabetes mellitus with diabetic autonomic (poly)neuropathy: Secondary | ICD-10-CM | POA: Diagnosis present

## 2024-02-19 DIAGNOSIS — Z720 Tobacco use: Secondary | ICD-10-CM | POA: Diagnosis present

## 2024-02-19 DIAGNOSIS — G4733 Obstructive sleep apnea (adult) (pediatric): Secondary | ICD-10-CM | POA: Insufficient documentation

## 2024-02-19 NOTE — Progress Notes (Signed)
 Cardiology Office Note:    Date:  02/19/2024  ID:  Christina Whitehead, DOB 1956-06-14, MRN [id number] PCP: Donata Snowman, PA-C   [facility] Providers Cardiologist:  Jerel Balding, MD Cardiology APP:  Parthenia Olivia HERO, PA-C { Click to update primary MD,subspecialty MD or APP then REFRESH:1}    {Click to Open Review  :1}   Patient Profile:       Chief Complaint: Preoperative cardiovascular exam History of Present Illness:  Christina Whitehead is a 68 y.o. female with visit-pertinent history of type 2 diabetes, hypertension, heavy tobacco use, reactive airway disease, struct of sleep apnea  She established with cardiology service on 05/31/2022 for chest discomfort.  Functional status difficult to assess due to sedentary lifestyle from back pain.  She underwent coronary CTA on 06/15/2022 showing coronary calcium  score of 442 (94th percentile) with mild 25-49% plaque in RCA, D1, and LAD as well as moderate 50-69% plaque in OM1.  FFR consistent with nonobstructive disease.  Echocardiogram 06/30/2022 with LVEF of 60 to 65%, no RWMA, mild LVH, grade 1 DD, RV function and size normal, no valvular abnormalities.  She has not been seen in office since this time.  She is now pending right reverse total shoulder arthroplasty on date TBD with [facility]   Discussed the use of AI scribe software for clinical note transcription with the patient, who gave verbal consent to proceed.  History of Present Illness Christina Whitehead is a 68 year old female with coronary artery disease who presents for pre-operative cardiac evaluation. She is accompanied by her husband, Oneil.  Today she is without anginal symptoms.  She denies any chest pain or exertional symptoms.  She remains relatively active.  Activity is somewhat limited due to her shoulder pain and neuropathy in her feet.  She does still wear 5 days a week at [employer] without any limitations.  She is able to walk up a flight of  stairs, walk a block, do house chores without limitations.  She is able to accomplish greater than 4 METS.  She is scheduled for reverse shoulder replacement surgery and requires a pre-operative cardiac evaluation.   She experiences occasional shortness of breath, which she attributes to her long history of smoking. She smokes about a pack and a half per day. She uses a CPAP machine nightly and cannot lay flat comfortably. Her blood pressure is well-controlled. She is on Ozempic for weight management, having recently dropped below 200 pounds for the first time in 25 years.  She has diabetes with her A1c consistently under 6 and takes metformin  for management. Her cholesterol levels were last recorded at 35 in November, below her target of <70 due to her coronary artery disease and smoking history.  She takes Lyrica for neuropathy in her feet, with a current dosage of 150 mg twice a day. She stopped taking aspirin  about three to four months ago due to concerns about bruising.   Review of systems:  Please see the history of present illness. All other systems are reviewed and otherwise negative.      Studies Reviewed:        Echocardiogram 06/30/2022 1. Left ventricular ejection fraction, by estimation, is 60 to 65%. The  left ventricle has normal function. The left ventricle has no regional  wall motion abnormalities. There is mild concentric left ventricular  hypertrophy. Left ventricular diastolic  parameters are consistent with Grade I diastolic dysfunction (impaired  relaxation). Elevated left ventricular end-diastolic pressure. The average  left ventricular global longitudinal strain is -20.8 %. The global  longitudinal strain is normal.   2. Right ventricular systolic function is normal. The right ventricular  size is normal.   3. The mitral valve is normal in structure. No evidence of mitral valve  regurgitation. No evidence of mitral stenosis.   4. The aortic valve is tricuspid.  Aortic valve regurgitation is trivial.  No aortic stenosis is present.   5. The inferior vena cava is normal in size with greater than 50%  respiratory variability, suggesting right atrial pressure of 3 mmHg.   Coronary CTA 06/15/2022 IMPRESSION: 1. Coronary calcium  score of 442. This was 94th percentile for age-, race-, and sex-matched controls.   2. Normal coronary origin with right dominance.   3. There is mild (25-49%) plaque in the RCA, D1, and LAD. There is moderate (50-69%) plaque in OM1. CAD-RADS 3.   4.  Will send study for FFR CT   5.  Aortic atherosclerosis  IMPRESSION: FFR CT findings are consistent with nonobstructive disease.   Recommend aggressive risk factor management including aspirin  81 mg daily and LDL goal less than 70. Risk Assessment/Calculations:              Physical Exam:   VS:  BP 108/72 (BP Location: Left Arm, Patient Position: Sitting, Cuff Size: Normal)   Pulse 74   Ht 5' 5 (1.651 m)   Wt 198 lb 12.8 oz (90.2 kg)   SpO2 94%   BMI 33.08 kg/m    Wt Readings from Last 3 Encounters:  02/19/24 198 lb 12.8 oz (90.2 kg)  01/21/24 200 lb (90.7 kg)  11/28/23 208 lb 5 oz (94.5 kg)    GEN: Well nourished, well developed in no acute distress NECK: No JVD; No carotid bruits CARDIAC: RRR, no murmurs, rubs, gallops RESPIRATORY:  Clear to auscultation without rales, wheezing or rhonchi  ABDOMEN: Soft, non-tender, non-distended EXTREMITIES:  No edema; No acute deformity      Assessment and Plan:  Coronary artery disease Coronary CTA on 05/2022 with coronary calcium  score of 442 (94th percentile) with FFR consistent with nonobstructive disease Echocardiogram 06/2022 with LVEF 60 to 65%, mild LVH, grade 1 DD - EKG shows no new acute ischemic changes - Today she is without anginal symptoms.  She denies any exertional chest pains.  Has chronic dyspnea on exertion due to tobacco abuse and COPD that is stable and unchanged.  There is no indication for  further ischemic evaluation at this time - Hematology discontinued patient's aspirin  due to bruising - Continue rosuvastatin  10 mg daily  Hyperlipidemia, LDL goal <70 LDL 35 on 06/2023 and well-controlled - Continue rosuvastatin  10 mg  Hypertension Blood pressure today well-controlled at 108/72 - Continue current antihypertensive regimen  COPD Tobacco abuse She has history of COPD secondary to tobacco use Long-term nicotine dependence and currently smoking 1/2 packs a day - Encouraged smoking cessation - On Trelegy management per PCP  Obstructive sleep apnea - She remains adherent to CPAP therapy  Type 2 diabetes with neuropathy A1c 6% on 11/2023 Controlled on semaglutide and metformin  - Management per PCP  Preoperative cardiovascular exam According to the Revised Cardiac Risk Index (RCRI), her Perioperative Risk of Major Cardiac Event is (%): 0.4. Her Functional Capacity in METs is: 5.07 according to the Duke Activity Status Index (DASI). Therefore, based on ACC/AHA guidelines, patient would be at acceptable risk for the planned procedure without further cardiovascular testing. I will route this recommendation to the requesting party  via Epic fax function.  She is no longer on aspirin  as this was discontinued 2 months ago by hematology due to bruising.       Dispo:  Return in about 1 year (around 02/18/2025).  Signed, Lum LITTIE Louis, NP

## 2024-02-19 NOTE — Patient Instructions (Signed)
 Medication Instructions:  NO CHANGES    Lab Work: NONE    Testing/Procedures: NONE  Follow-Up: At [facility], you and your health needs are our priority.  As part of our continuing mission to provide you with exceptional heart care, our providers are all part of one team.  This team includes your primary Cardiologist (physician) and Advanced Practice Providers or APPs (Physician Assistants and Nurse Practitioners) who all work together to provide you with the care you need, when you need it.  Your next appointment:   1 YEAR  Provider:   Jerel Balding, MD OR Lum Louis, NP

## 2024-02-20 ENCOUNTER — Encounter: Payer: Self-pay | Admitting: Emergency Medicine

## 2024-04-01 ENCOUNTER — Encounter (HOSPITAL_BASED_OUTPATIENT_CLINIC_OR_DEPARTMENT_OTHER): Payer: Self-pay | Admitting: Pulmonary Disease

## 2024-04-01 ENCOUNTER — Ambulatory Visit (HOSPITAL_BASED_OUTPATIENT_CLINIC_OR_DEPARTMENT_OTHER): Admitting: Pulmonary Disease

## 2024-04-01 VITALS — BP 114/76 | HR 78 | Ht 65.0 in | Wt 188.2 lb

## 2024-04-01 DIAGNOSIS — J4489 Other specified chronic obstructive pulmonary disease: Secondary | ICD-10-CM | POA: Diagnosis not present

## 2024-04-01 DIAGNOSIS — F17210 Nicotine dependence, cigarettes, uncomplicated: Secondary | ICD-10-CM

## 2024-04-01 DIAGNOSIS — G4733 Obstructive sleep apnea (adult) (pediatric): Secondary | ICD-10-CM

## 2024-04-01 NOTE — Patient Instructions (Signed)
 Peri-operative Assessment of Pulmonary Risk for Non-Thoracic Surgery: LOW RISK  For Ms. Ruffini, risk of perioperative pulmonary complications is increased by:  [X]  Age greater than 65 years  [X] COPD  [ ]  Serum albumin <3.5  [ ]  Smoking  [X]  Obstructive sleep apnea  [ ]  NYHA Class II Pulmonary Hypertension  Respiratory complications generally occur in 1% of ASA Class I patients, 5% of ASA Class II and 10% of ASA Class III-IV patients These complications rarely result in mortality and include postoperative pneumonia, atelectasis, pulmonary embolism, ARDS and increased time requiring postoperative mechanical ventilation.  Overall, I recommend proceeding with the surgery if the risk for respiratory complications are outweighed by the potential benefits. This will need to be discussed between the patient and surgeon.  To reduce risks of respiratory complications, I recommend: --Smoking cessation 6-8 weeks if possible --Pre- and post-operative incentive spirometry performed frequently while awake --Inpatient use of currently prescribed positive-pressure for OSA whenever the patient is sleeping --Avoiding/minimizing use of paralytics if possible during anesthesia.  1) RISK FOR PROLONGED MECHANICAL VENTILAION - > 48h   1A) Arozullah - Prolonged mech ventilation risk Arozullah Postperative Pulmonary Risk Score - for mech ventilation dependence >48h USAA, Ann Surg 2000, major non-cardiac surgery) Comment Score  Type of surgery - abd ao aneurysm (27), thoracic (21), neurosurgery / upper abdominal / vascular (21), neck (11)    Emergency Surgery - (11)     ALbumin < 3 or poor nutritional state - (9)     BUN > 30 -  (8)    Partial or completely dependent functional status - (7)     COPD -  (6) COPD 6  Age - 60 to 15 (4), > 70  (6)  4  TOTAL     Risk Stratifcation scores  - < 10 (0.5%), 11-19 (1.8%), 20-27 (4.2%), 28-40 (10.1%), >40 (26.6%)   10    ARISCAT: LOW RISK 1.6% risk of  in-hospital post-op pulmonary complications (composite including respiratory failure, respiratory infection, pleural effusion, atelectasis, pneumothorax, bronchospasm, aspiration pneumonitis)   Moderate persistent asthma +/- COPD - symptomatic  --CONTINUE Trelegy 100 ONE puff ONCE a day --CONTINUE Albuterol  AS NEEDED for shortness of breath or wheezing  Moderate OSA Patient uses NIV for more than four hours nightly for at least 70% of nights during the last three months of usage. The patient has been using and benefiting from PAP use and will continue to benefit from therapy.  --Counseled on sleep hygiene --Counseled on weight loss/maintenance of healthy weight --Counseled NOT to drive if/when sleepy --Advised patient to wear CPAP for at least 4 hours each night for greater than 70% of the time to avoid the machine being repossessed by insurance. --Consider repeating sleep study in the future once weight is stable  Tobacco abuse Patient is an active smoker. We discussed smoking cessation for <3 minutes. We discussed triggers and stressors and ways to deal with them. We discussed barriers to continued smoking and benefits of smoking cessation. Provided patient with information cessation techniques and interventions including [place] quitline. --Did not tolerate wellbutrin due to tremors

## 2024-04-01 NOTE — Progress Notes (Signed)
 Subjective:   PATIENT ID: Christina Whitehead GENDER: female DOB: [birth year], MRN: [id number]   HPI  Chief Complaint  Patient presents with   Medical Clearance    Having right shoulder repair needs clearance    Reason Christina Visit: Follow-up  Christina Whitehead is a 68 year old female active smoker with COPD-asthma overlap, OSA, HTN and DM2 who presents Christina follow-up OSA and COPD-asthma follow-up.  08/02/22 She is compliant with her CPAP and Advair twice a day however continues to have wheezing at night. She does feel her symptoms are improved but does have shortness of breath and wheeze with exertion. Cough unchanged that is productive. Continues to smoke 1.5 ppd.  11/08/22 She recent had an upper viral respiratory (flu negative) earlier this month, recent sick contact with RSV. Testing was neg but likely caught it from granddaughter. Has cough, shortness of breath or wheezing that remains persistent. PCP treated with depo medrol  shot and atrovent nebulizer and azithromycin. She is compliant with Advair 250. Has not picked up the 500 strength yet due to the cost. Reduced smoking from 1.5 ppd > 1 ppd. Compliant with CPAP and wears >4 hours nightly.  09/13/23 Since our last visit she has been less active due to her neuropathy. Her breathing comes and goes and was recently treated Christina pneumonia in December with levaquin and steroids. Improved coughing and wheezing. Doing well with CPAP and improved quality of sleep. She is not consistent with her Advair using it 2-3 times a week. Rarely using albuterol . Continues to smokes 1 ppd. Has productive cough, shortness of breath and wheezing  11/28/23 Since our last visit she reports breathing overall ok. Shortness of breath and occasional wheezing with activity. Chronic cough. No exacerbations since our last visit. Has lost 27 lbs since starting ozempic. Has been compliant with CPAP. She continues to smoke 1.5 ppd. She is starting work at [employer].  04/01/24 Since our last visit she is overall doing well on Trelegy and CPAP nightly. Denies shortness of breath except with long distances. Denies wheezing or coughing. Continues to smoke but decreased to 1 ppd. Planning Christina right reverse total shoulder arthroplasty with interscalene block. Last exacerbation in Dec 2024. She has lost 45 lbs in the last four months on ozempic.  Social History: Active smoker. Started at 14. 60 pack-years. Previously smoking 1.5 ppd. 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)   Past Medical History:  Diagnosis Date   Anxiety    Diabetes mellitus without complication (HCC)    Hypertension    Neuropathy      Family History  Problem Relation Age of Onset   Stroke Mother    Heart disease Mother    Renal cancer Mother    Breast cancer Maternal Aunt      Social History   Occupational History   Not on file  Tobacco Use   Smoking status: Every Day    Current packs/day: 3.00    Average packs/day: 3.0 packs/day Christina 54.6 years (163.8 ttl pk-yrs)    Types: Cigarettes    Start date: 47   Smokeless tobacco: Never   Tobacco comments:    02/19/2024 Patient smokes 1 1/2 daily    Currently smoking 1-1.5ppd as of 02/01/22 ep  Vaping Use   Vaping status: Never Used  Substance and Sexual Activity   Alcohol use: No   Drug use: No   Sexual activity: Not on file    Allergies  Allergen Reactions  Lisinopril Rash and Swelling    Diffuse rash. No throat swelling   Penicillins Hives    Rash Has patient had a PCN reaction causing immediate rash, facial/tongue/throat swelling, SOB or lightheadedness with hypotension: YES Has patient had a PCN reaction causing severe rash involving mucus membranes or skin necrosis: NO Has patient had a PCN reaction that required hospitalization: NO Has patient had a PCN reaction occurring within the last 10 years: NO} If all of the above answers are NO, then may proceed with Cephalosporin use.      Outpatient  Medications Prior to Visit  Medication Sig Dispense Refill   albuterol  (VENTOLIN  HFA) 108 (90 Base) MCG/ACT inhaler INHALE TWO PUFFS BY MOUTH INTO LUNGS EVERY 6 HOURS AS NEEDED Christina WHEEZING/SHORTNESS OF BREATH 18 g 3   aspirin  EC 81 MG tablet Take 81 mg by mouth daily. Swallow whole.     calcium  carbonate (OSCAL) 1500 (600 Ca) MG TABS tablet Take by mouth.     clonazePAM  (KLONOPIN ) 0.5 MG tablet Take 0.5 mg by mouth 2 (two) times daily.     docusate sodium  (COLACE) 100 MG capsule Take 1 capsule (100 mg total) by mouth every 12 (twelve) hours. 60 capsule 0   escitalopram (LEXAPRO) 20 MG tablet Take 20 mg by mouth daily.     hydrochlorothiazide  (HYDRODIURIL ) 25 MG tablet Take 1 tablet (25 mg total) by mouth daily. (Patient taking differently: Take 12.5 mg by mouth daily.) 90 tablet 3   hydrocortisone  (ANUSOL -HC) 2.5 % rectal cream Place 1 Application rectally 2 (two) times daily. 30 g 0   metFORMIN  (GLUCOPHAGE ) 1000 MG tablet Take 1,000 mg by mouth 2 (two) times daily with a meal.     nystatin (MYCOSTATIN/NYSTOP) powder Apply 1 Application topically 2 (two) times daily.     OZEMPIC, 1 MG/DOSE, 4 MG/3ML SOPN Inject 1 mg as directed once a week.     polyethylene glycol (MIRALAX ) 17 g packet Take 17 g by mouth daily. 14 each 0   pregabalin (LYRICA) 300 MG capsule Take 1 capsule by mouth twice a day as directed by physician.     rosuvastatin  (CRESTOR ) 10 MG tablet TAKE 1 TABLET BY MOUTH DAILY 90 tablet 3   TRELEGY ELLIPTA 100-62.5-25 MCG/ACT AEPB Inhale 1 puff into the lungs daily at 6 (six) AM.     vitamin B-12 (CYANOCOBALAMIN) 100 MCG tablet      hydrochlorothiazide  (HYDRODIURIL ) 12.5 MG tablet Take 12.5 mg by mouth daily. (Patient not taking: Reported on 04/01/2024)     metoCLOPramide  (REGLAN ) 10 MG tablet Take 1 tablet (10 mg total) by mouth every 6 (six) hours. (Patient not taking: Reported on 04/01/2024) 20 tablet 0   metoprolol  tartrate (LOPRESSOR ) 100 MG tablet Take one tablet two hours prior to  the test (Patient not taking: Reported on 04/01/2024) 1 tablet 0   Semaglutide,0.25 or 0.5MG /DOS, (OZEMPIC, 0.25 OR 0.5 MG/DOSE,) 2 MG/3ML SOPN Inject into the skin. (Patient not taking: Reported on 04/01/2024)     umeclidinium bromide  (INCRUSE ELLIPTA ) 62.5 MCG/ACT AEPB Inhale 1 puff into the lungs daily. (Patient not taking: Reported on 04/01/2024) 30 each 5   fluticasone -salmeterol (ADVAIR DISKUS) 500-50 MCG/ACT AEPB Inhale 1 puff into the lungs in the morning and at bedtime. (Patient not taking: Reported on 04/01/2024) 60 each 5   No facility-administered medications prior to visit.    Review of Systems  Constitutional:  Positive Christina weight loss. Negative Christina chills, diaphoresis, fever and malaise/fatigue.  HENT:  Negative Christina congestion.  Respiratory:  Negative Christina cough, hemoptysis, sputum production, shortness of breath and wheezing.   Cardiovascular:  Negative Christina chest pain, palpitations and leg swelling.    Objective:   Vitals:   04/01/24 0909  BP: 114/76  Pulse: 78  SpO2: 95%  Weight: 188 lb 3.2 oz (85.4 kg)  Height: 5' 5 (1.651 m)   SpO2: 95 %  Body mass index is 31.32 kg/m.  Physical Exam: General: Well-appearing, no acute distress HENT: [place], AT Eyes: EOMI, no scleral icterus Respiratory: Clear to auscultation bilaterally.  No crackles, wheezing or rales Cardiovascular: RRR, -M/R/G, no JVD Extremities:-Edema,-tenderness Neuro: AAO x4, CNII-XII grossly intact Psych: Normal mood, normal affect  Data Reviewed:  Imaging: CXR 06/03/12 - No acute cardiopulmonary disease CT Coronary 06/15/22 - Visualized lung parenchyma with no pulmonary nodules, masses, infiltrate, effusion or pneumothorax. CXR 07/24/23 - mild patchy bilateral lung opacities concerning Christina multifocal pneumonia CXR 09/13/23 - improved infiltrates with no residual pneumonia  PFT: 11/23/2021 FVC 1.97 (62%) FEV1 1.61 (67%) ratio 91 TLC 83% DLCO 85% Interpretation: Reduced FEV1 and FVC with significant  bronchodilator response present.  No restrictive defect and normal gas exchange.  Study suggestive of asthma  Sleep: Split night 11/03/21 - AHI 26.7 and nadir SpO2 85%. CPAP 12 cm H20 recommended.  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 OSA    Latest Ref Rng & Units 01/21/2024    8:25 PM 05/31/2022    3:51 PM 06/04/2012    4:43 AM  CMP  Glucose 70 - 99 mg/dL 88  872  829   BUN 8 - 23 mg/dL 10  19  17    Creatinine 0.44 - 1.00 mg/dL 9.58  9.10  9.17   Sodium 135 - 145 mmol/L 137  141  138   Potassium 3.5 - 5.1 mmol/L 3.4  4.2  4.0   Chloride 98 - 111 mmol/L 97  100  99   CO2 22 - 32 mmol/L 27  25  28    Calcium  8.9 - 10.3 mg/dL 9.4  9.3  9.6   Total Protein 6.5 - 8.1 g/dL 6.8   6.7   Total Bilirubin 0.0 - 1.2 mg/dL 1.7   0.6   Alkaline Phos 38 - 126 U/L 51   92   AST 15 - 41 U/L 16   17   ALT 0 - 44 U/L 11   19     CPAP compliance report 01/01/24-03/30/24 Usage days 90/90 days (100%) >4 hours 81 days (90%) Avg hours 5 hour 47 min CPAP 12 cm H20 EPR 2 AHI 0.5  Assessment & Plan:   Discussion: 68 year old female active smoker with COPD-asthma overlap, OSA on CPAP, HTN who presents Christina follow-up. Overall well controlled COPD-asthma however continues to smoke. Last exacerbation  >8 months in 07/2023. Compliant with CPAP. Discussed clinical course and management of COPD/asthma including smoking cessation bronchodilator regimen, preventive care and action plan Christina exacerbation. Presents Christina pre-op evaluation. Discussed risks as noted below.   Peri-operative Assessment of Pulmonary Risk  Christina Non-Thoracic Surgery: LOW RISK  Christina Whitehead, risk of perioperative pulmonary complications is  increased by:  [X]  Age greater than 65 years  [X] COPD  [ ]  Serum albumin <3.5  [ ]  Smoking  [X]  Obstructive sleep apnea  [ ]  NYHA Class II Pulmonary Hypertension  Respiratory complications generally occur in 1% of ASA Class I patients, 5% of ASA Class II and 10% of ASA Class III-IV patients These complications rarely result in mortality and include postoperative pneumonia, atelectasis, pulmonary embolism, ARDS and increased time requiring postoperative mechanical ventilation.  Overall, I recommend proceeding with the surgery if the risk Christina respiratory complications are outweighed by the potential benefits. This will need to be discussed between the patient and surgeon.  To reduce risks of respiratory complications, I recommend: --Smoking cessation 6-8 weeks if possible --Pre- and post-operative incentive spirometry performed frequently while awake --Inpatient use of currently prescribed positive-pressure Christina OSA whenever the patient is sleeping --Avoiding/minimizing use of paralytics if possible during anesthesia.  1) RISK Christina PROLONGED MECHANICAL VENTILAION - > 48h   1A) [place] - Prolonged mech ventilation risk [place] Postperative Pulmonary Risk Score - Christina mech ventilation dependence >48h USAA, Ann Surg 2000, major non-cardiac surgery) Comment Score  Type of surgery - abd ao aneurysm (27), thoracic (21), neurosurgery / upper abdominal / vascular (21), neck (11)    Emergency Surgery - (11)     ALbumin < 3 or poor nutritional state - (9)     BUN > 30 -  (8)    Partial or completely dependent functional status - (7)     COPD -  (6) COPD 6  Age - 60 to 2 (4), > 70  (6)  4  TOTAL     Risk Stratifcation scores  - < 10 (0.5%), 11-19 (1.8%), 20-27 (4.2%), 28-40 (10.1%), >40 (26.6%)   10    ARISCAT: LOW RISK 1.6% risk of in-hospital post-op pulmonary complications (composite including respiratory failure, respiratory infection, pleural effusion, atelectasis,  pneumothorax, bronchospasm, aspiration pneumonitis)   Moderate persistent asthma +/- COPD - symptomatic  --CONTINUE Trelegy 100 ONE puff ONCE a day --CONTINUE Albuterol  AS NEEDED Christina shortness of breath or wheezing  Moderate OSA Patient uses NIV Christina more than four hours nightly Christina at least 70% of nights during the last three months of usage. The patient has been using and benefiting from PAP use and will continue to benefit from therapy.  --Counseled on sleep hygiene --Counseled on weight loss/maintenance of healthy weight --Counseled NOT to drive if/when sleepy --Advised patient to wear CPAP Christina at least 4 hours each night Christina greater than 70% of the time to avoid the machine being repossessed by insurance. --Consider repeating sleep study in the future once weight is stable  Tobacco abuse Patient is an active smoker. We discussed smoking cessation Christina <3 minutes. We discussed triggers and stressors and ways to deal with them. We discussed barriers to continued smoking and benefits of smoking cessation. Provided patient with information cessation techniques and interventions including Jack quitline. --Did not tolerate wellbutrin due to tremors   Health Maintenance Immunization History  Administered Date(s) Administered   Influenza Inj Mdck Quad Pf 05/05/2022   Moderna Sars-Covid-2 Vaccination 10/29/2019, 11/26/2019, 07/12/2020   CT Lung Screen - Scheduled  No orders of the defined types were placed in this encounter.  No orders of the defined types were placed in this encounter.   Return in about 6 months (around 10/02/2024).  I have spent a total  time of 35-minutes on the day of the appointment including chart review, data review, collecting history, coordinating care and discussing medical diagnosis and plan with the patient/family. Past medical history, allergies, medications were reviewed. Pertinent imaging, labs and tests included in this note have been reviewed and interpreted  independently by me.  Jyasia Markoff Slater Staff, MD [facility] 04/01/2024 9:30 AM

## 2024-04-03 NOTE — Telephone Encounter (Signed)
 The note from 04/01/24 was faxed to [facility].

## 2024-04-17 ENCOUNTER — Encounter (HOSPITAL_BASED_OUTPATIENT_CLINIC_OR_DEPARTMENT_OTHER): Payer: Self-pay | Admitting: Orthopaedic Surgery

## 2024-04-18 ENCOUNTER — Encounter (HOSPITAL_BASED_OUTPATIENT_CLINIC_OR_DEPARTMENT_OTHER)
Admission: RE | Admit: 2024-04-18 | Discharge: 2024-04-18 | Disposition: A | Discharge status: Home / Self Care | Source: Ambulatory Visit | Attending: Orthopaedic Surgery | Admitting: Orthopaedic Surgery

## 2024-04-18 DIAGNOSIS — Z01818 Encounter for other preprocedural examination: Secondary | ICD-10-CM | POA: Insufficient documentation

## 2024-04-18 LAB — BASIC METABOLIC PANEL WITH GFR
Anion gap: 14 (ref 5–15)
BUN: 12 mg/dL (ref 8–23)
CO2: 28 mmol/L (ref 22–32)
Calcium: 9.7 mg/dL (ref 8.9–10.3)
Chloride: 98 mmol/L (ref 98–111)
Creatinine, Ser: 0.53 mg/dL (ref 0.44–1.00)
GFR, Estimated: >60 mL/min (ref >60)
Glucose, Bld: 85 mg/dL (ref 70–99)
Potassium: 3.5 mmol/L (ref 3.5–5.1)
Sodium: 140 mmol/L (ref 135–145)

## 2024-04-18 LAB — SURGICAL PCR SCREEN
MRSA, PCR: NEGATIVE
Staphylococcus aureus: NEGATIVE

## 2024-04-22 NOTE — H&P (Signed)
 PREOPERATIVE H&P  Chief Complaint: osteoarthritis of right shoulder  HPI: Christina Whitehead is a 68 y.o. female who is scheduled for Procedure(s): ARTHROPLASTY, SHOULDER, TOTAL, REVERSE.   Patient has a past medical history significant for DM, smoker.   Patient has had trouble in her right shoulder for quite some time. She has tried and failed injections and a home exercise program. She feels like her shoulder is unstable.   Symptoms are rated as moderate to severe, and have been worsening.  This is significantly impairing activities of daily living.    Please see clinic note for further details on this patient's care.    She has elected for surgical management.   Past Medical History:  Diagnosis Date   Anxiety    Diabetes mellitus without complication (HCC)    Neuropathy    Sleep apnea    Past Surgical History:  Procedure Laterality Date   ABDOMINAL HYSTERECTOMY     CESAREAN SECTION     x2   ROTATOR CUFF REPAIR Left    Social History   Socioeconomic History   Marital status: Married    Spouse name: Not on file   Number of children: Not on file   Years of education: Not on file   Highest education level: Not on file  Occupational History   Not on file  Tobacco Use   Smoking status: Every Day    Current packs/day: 3.00    Average packs/day: 3.0 packs/day for 54.7 years (164.0 ttl pk-yrs)    Types: Cigarettes    Start date: 29   Smokeless tobacco: Never   Tobacco comments:    02/19/2024 Patient smokes 1 1/2 daily    Currently smoking 1-1.5ppd as of 02/01/22 ep  Vaping Use   Vaping status: Never Used  Substance and Sexual Activity   Alcohol use: No   Drug use: No   Sexual activity: Not on file  Other Topics Concern   Not on file  Social History Narrative   Not on file   Social Drivers of Health   Financial Resource Strain: Medium Risk (12/12/2023)   Received from [facility]   Overall Financial Resource Strain (CARDIA)    Difficulty of Paying  Living Expenses: Somewhat hard  Food Insecurity: Food Insecurity Present (12/12/2023)   Received from [facility]   Hunger Vital Sign    Within the past 12 months, you worried that your food would run out before you got the money to buy more.: Often true    Within the past 12 months, the food you bought just didn't last and you didn't have money to get more.: Sometimes true  Transportation Needs: Unmet Transportation Needs (12/12/2023)   Received from [facility] - Transportation    Lack of Transportation (Medical): No    Lack of Transportation (Non-Medical): Yes  Physical Activity: Insufficiently Active (12/12/2023)   Received from [facility]   Exercise Vital Sign    On average, how many days per week do you engage in moderate to strenuous exercise (like a brisk walk)?: 1 day    On average, how many minutes do you engage in exercise at this level?: 20 min  Stress: Stress Concern Present (12/12/2023)   Received from [facility] - Occupational Stress Questionnaire    Feeling of Stress : Rather much  Social Connections: Socially Integrated (12/12/2023)   Received from [facility]   Social Network    How  would you rate your social network (family, work, friends)?: Good participation with social networks   Family History  Problem Relation Age of Onset   Stroke Mother    Heart disease Mother    Renal cancer Mother    Breast cancer Maternal Aunt    Allergies  Allergen Reactions   Lisinopril Rash and Swelling    Diffuse rash. No throat swelling   Latex Hives   Penicillins Hives    Rash Has patient had a PCN reaction causing immediate rash, facial/tongue/throat swelling, SOB or lightheadedness with hypotension: YES Has patient had a PCN reaction causing severe rash involving mucus membranes or skin necrosis: NO Has patient had a PCN reaction that required hospitalization: NO Has patient had a PCN reaction occurring within  the last 10 years: NO} If all of the above answers are NO, then may proceed with Cephalosporin use.    Prior to Admission medications   Medication Sig Start Date End Date Taking? Authorizing Provider  calcium  carbonate (OSCAL) 1500 (600 Ca) MG TABS tablet Take by mouth.   Yes [provider]  docusate sodium  (COLACE) 100 MG capsule Take 1 capsule (100 mg total) by mouth every 12 (twelve) hours. 01/21/24  Yes Daralene Bruckner D, PA-C  escitalopram (LEXAPRO) 20 MG tablet Take 20 mg by mouth daily. 03/01/21  Yes [provider]  hydrochlorothiazide  (HYDRODIURIL ) 25 MG tablet Take 1 tablet (25 mg total) by mouth daily. Patient taking differently: Take 12.5 mg by mouth daily. 06/26/12  Yes Parthenia Olivia HERO, PA-C  metFORMIN  (GLUCOPHAGE ) 1000 MG tablet Take 1,000 mg by mouth 2 (two) times daily with a meal.   Yes [provider]  polyethylene glycol (MIRALAX ) 17 g packet Take 17 g by mouth daily. 01/21/24  Yes Daralene Bruckner D, PA-C  pregabalin (LYRICA) 300 MG capsule Take 1 capsule by mouth twice a day as directed by physician. 09/23/21  Yes [provider]  rosuvastatin  (CRESTOR ) 10 MG tablet TAKE 1 TABLET BY MOUTH DAILY 06/14/23  Yes Croitoru, Mihai, MD  TRELEGY ELLIPTA 100-62.5-25 MCG/ACT AEPB Inhale 1 puff into the lungs daily at 6 (six) AM. 01/23/24  Yes [provider]  vitamin B-12 (CYANOCOBALAMIN) 100 MCG tablet  03/01/21  Yes [provider]  albuterol  (VENTOLIN  HFA) 108 (90 Base) MCG/ACT inhaler INHALE TWO PUFFS BY MOUTH INTO LUNGS EVERY 6 HOURS AS NEEDED FOR WHEEZING/SHORTNESS OF BREATH 12/25/22   Kassie Acquanetta Bradley, MD  aspirin  EC 81 MG tablet Take 81 mg by mouth daily. Swallow whole.    [provider]  hydrocortisone  (ANUSOL -HC) 2.5 % rectal cream Place 1 Application rectally 2 (two) times daily. 01/21/24   Daralene Bruckner D, PA-C  nystatin (MYCOSTATIN/NYSTOP) powder Apply 1 Application topically 2 (two) times daily. 05/24/23    [provider]  OZEMPIC, 1 MG/DOSE, 4 MG/3ML SOPN Inject 1 mg as directed once a week. 11/20/23   [provider]  Semaglutide,0.25 or 0.5MG /DOS, (OZEMPIC, 0.25 OR 0.5 MG/DOSE,) 2 MG/3ML SOPN Inject into the skin. Patient not taking: Reported on 04/01/2024    [provider]  umeclidinium bromide  (INCRUSE ELLIPTA ) 62.5 MCG/ACT AEPB Inhale 1 puff into the lungs daily. Patient not taking: No sig reported 11/28/23   Kassie Acquanetta Bradley, MD    ROS: All other systems have been reviewed and were otherwise negative with the exception of those mentioned in the HPI and as above.  Physical Exam: General: Alert, no acute distress Cardiovascular: No pedal edema Respiratory: No cyanosis, no use of  accessory musculature GI: No organomegaly, abdomen is soft and non-tender Skin: No lesions in the area of chief complaint Neurologic: Sensation intact distally Psychiatric: Patient is competent for consent with normal mood and affect Lymphatic: No axillary or cervical lymphadenopathy  MUSCULOSKELETAL:  The range of motion of her shoulder is to about 90 degrees. External rotation to 30. Cuff strength is weak, without testing. She internal rotation to beltline.   Imaging: MRI demonstrates a supraspinatus, infraspinatus and subscapularis tear. There is significant retraction of the cuff past the mid humeral head.  BMI: Body mass index is 31.58 kg/m.  Lab Results  Component Value Date   ALBUMIN 3.9 01/21/2024   Diabetes:   Patient has a diagnosis of diabetes,  Lab Results  Component Value Date   HGBA1C 6.3 (H) 06/03/2012   Smoking Status: Social History   Tobacco Use  Smoking Status Every Day   Current packs/day: 3.00   Average packs/day: 3.0 packs/day for 54.7 years (164.0 ttl pk-yrs)   Types: Cigarettes   Start date: 1971  Smokeless Tobacco Never  Tobacco Comments   02/19/2024 Patient smokes 1 1/2 daily   Currently smoking 1-1.5ppd as of 02/01/22 ep   Ready to  quit: Not Answered Counseling given: Not Answered Tobacco comments: 02/19/2024 Patient smokes 1 1/2 daily Currently smoking 1-1.5ppd as of 02/01/22 ep  The patient has participated in a 4-week cessation program.           Assessment: osteoarthritis of right shoulder  Plan: Plan for Procedure(s): ARTHROPLASTY, SHOULDER, TOTAL, REVERSE  The risks benefits and alternatives were discussed with the patient including but not limited to the risks of nonoperative treatment, versus surgical intervention including infection, bleeding, nerve injury,  blood clots, cardiopulmonary complications, morbidity, mortality, among others, and they were willing to proceed.   We additionally specifically discussed risks of axillary nerve injury, infection, periprosthetic fracture, continued pain and longevity of implants prior to beginning procedure.    Patient will be closely monitored in PACU for medical stabilization and pain control. If found stable in PACU, patient may be discharged home with outpatient follow-up. If any concerns regarding patient's stabilization patient will be admitted for observation after surgery. The patient is planning to be discharged home with outpatient PT.   The patient acknowledged the explanation, agreed to proceed with the plan and consent was signed.   Operative Plan: Right reverse total shoulder arthroplasty Discharge Medications: standard DVT Prophylaxis: aspirin  Physical Therapy: outpatient PT Special Discharge needs: Sling (should bring with her). IceMan   Aleck LOISE Stalling, PA-C  04/22/2024 7:59 AM

## 2024-04-23 NOTE — Discharge Instructions (Signed)
 Bonner Hair MD, MPH Aleck Stalling, PA-C [facility] [address] [phone] (tel)   [phone] (fax)   POST-OPERATIVE INSTRUCTIONS - TOTAL SHOULDER REPLACEMENT    WOUND CARE You may leave the operative dressing in place until your follow-up appointment. KEEP THE INCISIONS CLEAN AND DRY. There may be a small amount of fluid/bleeding leaking at the surgical site. This is normal after surgery.  If it fills with liquid or blood please call us  immediately to change it for you. Use the provided ice machine or Ice packs as often as possible for the first 3-4 days, then as needed for pain relief.   Keep a layer of cloth or a shirt between your skin and the cooling unit to prevent frost bite as it can get very cold.  SHOWERING: - You may shower on Post-Op Day #2.  - The dressing is water resistant but do not scrub it as it may start to peel up.   - You may remove the sling for showering - Gently pat the area dry.  - Do not soak the shoulder in water.  - Do not go swimming in the pool or ocean until your incision has completely healed (about 4-6 weeks after surgery) - KEEP THE INCISIONS CLEAN AND DRY.  EXERCISES Wear the sling at all times  You may remove the sling for showering, but keep the arm across the chest or in a secondary sling.    Accidental/Purposeful External Rotation and shoulder flexion (reaching behind you) is to be avoided at all costs for the first month. It is ok to come out of your sling if your are sitting and have assistance for eating.   Do not lift anything heavier than 1 pound until we discuss it further in clinic.  It is normal for your fingers/hand to become more swollen after surgery and discolored from bruising.   This will resolve over the first few weeks usually after surgery. Please continue to ambulate and do not stay sitting or lying for too long.  Perform foot and wrist pumps to assist in circulation.  PHYSICAL  THERAPY - You will begin physical therapy soon after surgery (unless otherwise specified) - Please call to set up an appointment, if you do not already have one  - Let our office if there are any issues with scheduling your therapy  - You have a physical therapy appointment scheduled at [facility] (across the hall from our office) on 9/8  REGIONAL ANESTHESIA (NERVE BLOCKS) The anesthesia team may have performed a nerve block for you this is a great tool used to minimize pain.   The block may start wearing off overnight (between 8-24 hours postop) When the block wears off, your pain may go from nearly zero to the pain you would have had postop without the block. This is an abrupt transition but nothing dangerous is happening.   This can be a challenging period but utilize your as needed pain medications to try and manage this period. We suggest you use the pain medication the first night prior to going to bed, to ease this transition.  You may take an extra dose of narcotic when this happens if needed   POST-OP MEDICATIONS- Multimodal approach to pain control In general your pain will be controlled with a combination of substances.  Prescriptions unless otherwise discussed are electronically sent to your pharmacy.  This is a carefully made plan we use to minimize narcotic use.  Celebrex  - Anti-inflammatory medication taken on a scheduled basis Acetaminophen  - Non-narcotic pain medicine taken on a scheduled basis  Oxycodone  - This is a strong narcotic, to be used only on an "as needed" basis for SEVERE pain. Aspirin  81mg  - This medicine is used to minimize the risk of blood clots after surgery. Omeprazole  - daily medicine to protect your stomach while taking anti-inflammatories.   Zofran  -  take as needed for nausea   FOLLOW-UP If you develop a Fever (>101.5), Redness or Drainage from the surgical incision site, please call our office to arrange for an evaluation. Please call the office to  schedule a follow-up appointment for a wound check, 7-10 days post-operatively.  IF YOU HAVE ANY QUESTIONS, PLEASE FEEL FREE TO CALL OUR OFFICE.  HELPFUL INFORMATION  Your arm will be in a sling following surgery. You will be in this sling for the next 4 weeks.   You may be more comfortable sleeping in a semi-seated position the first few nights following surgery.  Keep a pillow propped under the elbow and forearm for comfort.  If you have a recliner type of chair it might be beneficial.  If not that is fine too, but it would be helpful to sleep propped up with pillows behind your operated shoulder as well under your elbow and forearm.  This will reduce pulling on the suture lines.  When dressing, put your operative arm in the sleeve first.  When getting undressed, take your operative arm out last.  Loose fitting, button-down shirts are recommended.  In most states it is against the law to drive while your arm is in a sling. And certainly against the law to drive while taking narcotics.  You may return to work/school in the next couple of days when you feel up to it. Desk work and typing in the sling is fine.  We suggest you use the pain medication the first night prior to going to bed, in order to ease any pain when the anesthesia wears off. You should avoid taking pain medications on an empty stomach as it will make you nauseous.  You should wean off your narcotic medicines as soon as you are able.     Most patients will be off narcotics before their first postop appointment.   We do not refill narcotics   Do not drink alcoholic beverages or take illicit drugs when taking pain medications.  Pain medication may make you constipated.  Below are a few solutions to try in this order: Decrease the amount of pain medication if you aren't having pain. Drink lots of decaffeinated fluids. Drink prune juice and/or each dried prunes  If the first 3 don't work start with additional solutions Take  Colace - an over-the-counter stool softener Take Senokot - an over-the-counter laxative Take Miralax  - a stronger over-the-counter laxative   Dental Antibiotics:  We require dental prophylaxis for 2 years after a shoulder replacement  Contact your surgeon for an antibiotic prescription, prior to your dental procedure.   For more information including helpful videos and documents visit our website:   [email or web address]    Post Anesthesia Home Care Instructions  Activity: Get plenty of rest for the remainder of the day. A responsible individual must stay with you for 24 hours following the procedure.  For the next 24 hours, DO NOT: -Drive a car -Advertising copywriter -Drink alcoholic beverages -Take any medication unless instructed by your physician -Make any legal decisions or sign important papers.  Meals: Start  with liquid foods such as gelatin or soup. Progress to regular foods as tolerated. Avoid greasy, spicy, heavy foods. If nausea and/or vomiting occur, drink only clear liquids until the nausea and/or vomiting subsides. Call your physician if vomiting continues.  Special Instructions/Symptoms: Your throat may feel dry or sore from the anesthesia or the breathing tube placed in your throat during surgery. If this causes discomfort, gargle with warm salt water. The discomfort should disappear within 24 hours.  Regional Anesthesia Blocks  1. You may not be able to move or feel the blocked extremity after a regional anesthetic block. This may last may last from 3-48 hours after placement, but it will go away. The length of time depends on the medication injected and your individual response to the medication. As the nerves start to wake up, you may experience tingling as the movement and feeling returns to your extremity. If the numbness and inability to move your extremity has not gone away after 48 hours, please call your surgeon.   2. The  extremity that is blocked will need to be protected until the numbness is gone and the strength has returned. Because you cannot feel it, you will need to take extra care to avoid injury. Because it may be weak, you may have difficulty moving it or using it. You may not know what position it is in without looking at it while the block is in effect.  3. For blocks in the legs and feet, returning to weight bearing and walking needs to be done carefully. You will need to wait until the numbness is entirely gone and the strength has returned. You should be able to move your leg and foot normally before you try and bear weight or walk. You will need someone to be with you when you first try to ensure you do not fall and possibly risk injury.  4. Bruising and tenderness at the needle site are common side effects and will resolve in a few days.  5. Persistent numbness or new problems with movement should be communicated to the surgeon or the [facility] [phone] [facility] [phone]).  Information for Discharge Teaching: EXPAREL  (bupivacaine  liposome injectable suspension)   Pain relief is important to your recovery. The goal is to control your pain so you can move easier and return to your normal activities as soon as possible after your procedure. Your physician may use several types of medicines to manage pain, swelling, and more.  Your surgeon or anesthesiologist gave you EXPAREL (bupivacaine ) to help control your pain after surgery.  EXPAREL  is a local anesthetic designed to release slowly over an extended period of time to provide pain relief by numbing the tissue around the surgical site. EXPAREL  is designed to release pain medication over time and can control pain for up to 72 hours. Depending on how you respond to EXPAREL , you may require less pain medication during your recovery. EXPAREL  can help reduce or eliminate the need for opioids during the first few  days after surgery when pain relief is needed the most. EXPAREL  is not an opioid and is not addictive. It does not cause sleepiness or sedation.   Important! A teal colored band has been placed on your arm with the date, time and amount of EXPAREL  you have received. Please leave this armband in place for the full 96 hours following administration, and then you may remove the band. If you return to the hospital for any reason within 96  hours following the administration of EXPAREL , the armband provides important information that your health care providers to know, and alerts them that you have received this anesthetic.    Possible side effects of EXPAREL : Temporary loss of sensation or ability to move in the area where medication was injected. Nausea, vomiting, constipation Rarely, numbness and tingling in your mouth or lips, lightheadedness, or anxiety may occur. Call your doctor right away if you think you may be experiencing any of these sensations, or if you have other questions regarding possible side effects.  Follow all other discharge instructions given to you by your surgeon or nurse. Eat a healthy diet and drink plenty of water or other fluids.  Next dose of tylenol  if needed will be at 12:37pm

## 2024-04-23 NOTE — Anesthesia Preprocedure Evaluation (Signed)
 Anesthesia Evaluation  Patient identified by MRN, date of birth, ID band Patient awake    Reviewed: Allergy & Precautions, NPO status , Patient's Chart, lab work & pertinent test results  Airway Mallampati: III  TM Distance: >3 FB Neck ROM: Full    Dental no notable dental hx. (+) Teeth Intact, Dental Advisory Given   Pulmonary asthma , sleep apnea and Continuous Positive Airway Pressure Ventilation , Current Smoker and Patient abstained from smoking.   Pulmonary exam normal breath sounds clear to auscultation       Cardiovascular hypertension, (-) angina (-) Past MI Normal cardiovascular exam Rhythm:Regular Rate:Normal  2023 TTE EF 65%   Neuro/Psych   Anxiety        GI/Hepatic negative GI ROS, Neg liver ROS,,,  Endo/Other  diabetes, Well Controlled, Type 2    Renal/GU      Musculoskeletal  (+) Arthritis ,    Abdominal   Peds  Hematology   Anesthesia Other Findings All: Latex, Pcn, lisinopril  Reproductive/Obstetrics                              Anesthesia Physical Anesthesia Plan  ASA: 3  Anesthesia Plan: General and Regional   Post-op Pain Management: Regional block*, Minimal or no pain anticipated and Tylenol  PO (pre-op)*   Induction: Intravenous  PONV Risk Score and Plan: 3 and Treatment may vary due to age or medical condition, Midazolam , Ondansetron  and Dexamethasone   Airway Management Planned: Oral ETT  Additional Equipment: None  Intra-op Plan:   Post-operative Plan: Extubation in OR  Informed Consent: I have reviewed the patients History and Physical, chart, labs and discussed the procedure including the risks, benefits and alternatives for the proposed anesthesia with the patient or authorized representative who has indicated his/her understanding and acceptance.     Dental advisory given  Plan Discussed with: CRNA and Surgeon  Anesthesia Plan Comments: (GA w R  ISB)         Anesthesia Quick Evaluation

## 2024-04-24 ENCOUNTER — Ambulatory Visit (HOSPITAL_BASED_OUTPATIENT_CLINIC_OR_DEPARTMENT_OTHER): Payer: Self-pay | Admitting: Anesthesiology

## 2024-04-24 ENCOUNTER — Ambulatory Visit (HOSPITAL_COMMUNITY)

## 2024-04-24 ENCOUNTER — Other Ambulatory Visit: Payer: Self-pay

## 2024-04-24 ENCOUNTER — Ambulatory Visit (HOSPITAL_BASED_OUTPATIENT_CLINIC_OR_DEPARTMENT_OTHER)
Admission: RE | Admit: 2024-04-24 | Discharge: 2024-04-24 | Disposition: A | Discharge status: Home / Self Care | Attending: Orthopaedic Surgery | Admitting: Orthopaedic Surgery

## 2024-04-24 ENCOUNTER — Encounter (HOSPITAL_BASED_OUTPATIENT_CLINIC_OR_DEPARTMENT_OTHER)
Admission: RE | Disposition: A | Discharge status: Home / Self Care | Payer: Self-pay | Source: Home / Self Care | Attending: Orthopaedic Surgery

## 2024-04-24 ENCOUNTER — Encounter (HOSPITAL_BASED_OUTPATIENT_CLINIC_OR_DEPARTMENT_OTHER): Payer: Self-pay | Admitting: Orthopaedic Surgery

## 2024-04-24 DIAGNOSIS — Z01818 Encounter for other preprocedural examination: Secondary | ICD-10-CM

## 2024-04-24 DIAGNOSIS — I10 Essential (primary) hypertension: Secondary | ICD-10-CM

## 2024-04-24 DIAGNOSIS — Z7985 Long-term (current) use of injectable non-insulin antidiabetic drugs: Secondary | ICD-10-CM | POA: Diagnosis not present

## 2024-04-24 DIAGNOSIS — F17210 Nicotine dependence, cigarettes, uncomplicated: Secondary | ICD-10-CM

## 2024-04-24 DIAGNOSIS — G4733 Obstructive sleep apnea (adult) (pediatric): Secondary | ICD-10-CM

## 2024-04-24 DIAGNOSIS — Z7984 Long term (current) use of oral hypoglycemic drugs: Secondary | ICD-10-CM | POA: Insufficient documentation

## 2024-04-24 DIAGNOSIS — M75101 Unspecified rotator cuff tear or rupture of right shoulder, not specified as traumatic: Secondary | ICD-10-CM | POA: Insufficient documentation

## 2024-04-24 DIAGNOSIS — M19011 Primary osteoarthritis, right shoulder: Secondary | ICD-10-CM | POA: Diagnosis present

## 2024-04-24 DIAGNOSIS — G4730 Sleep apnea, unspecified: Secondary | ICD-10-CM | POA: Insufficient documentation

## 2024-04-24 DIAGNOSIS — E1140 Type 2 diabetes mellitus with diabetic neuropathy, unspecified: Secondary | ICD-10-CM | POA: Insufficient documentation

## 2024-04-24 HISTORY — DX: Sleep apnea, unspecified: G47.30

## 2024-04-24 HISTORY — PX: REVERSE SHOULDER ARTHROPLASTY: SHX5054

## 2024-04-24 LAB — GLUCOSE, CAPILLARY
Glucose-Capillary: 104 mg/dL — ABNORMAL HIGH (ref 70–99)
Glucose-Capillary: 99 mg/dL (ref 70–99)

## 2024-04-24 SURGERY — ARTHROPLASTY, SHOULDER, TOTAL, REVERSE
Anesthesia: Regional | Site: Shoulder | Laterality: Right

## 2024-04-24 MED ORDER — SODIUM CHLORIDE 0.9 % IR SOLN
Status: DC | PRN
  Administered 2024-04-24: 2000 mL

## 2024-04-24 MED ORDER — CELECOXIB 100 MG PO CAPS: 100.0000 mg | ORAL_CAPSULE | Freq: Two times a day (BID) | ORAL | 0 refills | Status: AC

## 2024-04-24 MED ORDER — ONDANSETRON HCL 4 MG/2ML IJ SOLN
INTRAMUSCULAR | Status: DC | PRN
  Administered 2024-04-24: 4 mg via INTRAVENOUS

## 2024-04-24 MED ORDER — GABAPENTIN 300 MG PO CAPS
300.0000 mg | ORAL_CAPSULE | Freq: Once | ORAL | Status: AC
  Administered 2024-04-24: 300 mg via ORAL

## 2024-04-24 MED ORDER — LIDOCAINE HCL (CARDIAC) PF 100 MG/5ML IV SOSY
PREFILLED_SYRINGE | INTRAVENOUS | Status: DC | PRN
Start: 2024-04-24 — End: 2024-04-24
  Administered 2024-04-24: 60 mg via INTRAVENOUS

## 2024-04-24 MED ORDER — ONDANSETRON HCL 4 MG PO TABS: 4.0000 mg | ORAL_TABLET | Freq: Three times a day (TID) | ORAL | 0 refills | Status: AC | PRN

## 2024-04-24 MED ORDER — ASPIRIN 81 MG PO CHEW
81.0000 mg | CHEWABLE_TABLET | Freq: Two times a day (BID) | ORAL | 0 refills | Status: AC
Start: 2024-04-24 — End: 2024-06-05

## 2024-04-24 MED ORDER — FENTANYL CITRATE (PF) 100 MCG/2ML IJ SOLN
25.0000 ug | Freq: Once | INTRAMUSCULAR | Status: AC
  Administered 2024-04-24: 25 ug via INTRAVENOUS

## 2024-04-24 MED ORDER — 0.9 % SODIUM CHLORIDE (POUR BTL) OPTIME
TOPICAL | Status: DC | PRN
  Administered 2024-04-24: 1000 mL

## 2024-04-24 MED ORDER — OMEPRAZOLE 20 MG PO CPDR: 20.0000 mg | DELAYED_RELEASE_CAPSULE | Freq: Every day | ORAL | 0 refills | Status: AC

## 2024-04-24 MED ORDER — ACETAMINOPHEN 500 MG PO TABS
ORAL_TABLET | ORAL | Status: AC
  Filled 2024-04-24: qty 2

## 2024-04-24 MED ORDER — FENTANYL CITRATE (PF) 100 MCG/2ML IJ SOLN
INTRAMUSCULAR | Status: AC
  Filled 2024-04-24: qty 2

## 2024-04-24 MED ORDER — ONDANSETRON HCL 4 MG/2ML IJ SOLN: 4.0000 mg | Freq: Once | INTRAMUSCULAR | Status: DC | PRN

## 2024-04-24 MED ORDER — ROCURONIUM BROMIDE 100 MG/10ML IV SOLN
INTRAVENOUS | Status: DC | PRN
  Administered 2024-04-24: 60 mg via INTRAVENOUS

## 2024-04-24 MED ORDER — HYDROMORPHONE HCL 1 MG/ML IJ SOLN: 0.2500 mg | INTRAMUSCULAR | Status: DC | PRN

## 2024-04-24 MED ORDER — ALBUTEROL SULFATE HFA 108 (90 BASE) MCG/ACT IN AERS
INHALATION_SPRAY | RESPIRATORY_TRACT | Status: DC | PRN
  Administered 2024-04-24: 4 via RESPIRATORY_TRACT

## 2024-04-24 MED ORDER — VANCOMYCIN HCL 1000 MG IV SOLR
INTRAVENOUS | Status: AC
  Filled 2024-04-24: qty 20

## 2024-04-24 MED ORDER — DEXMEDETOMIDINE HCL IN NACL 80 MCG/20ML IV SOLN
INTRAVENOUS | Status: AC
  Filled 2024-04-24: qty 20

## 2024-04-24 MED ORDER — EPHEDRINE SULFATE (PRESSORS) 50 MG/ML IJ SOLN
INTRAMUSCULAR | Status: DC | PRN
  Administered 2024-04-24 (×3): 5 mg via INTRAVENOUS

## 2024-04-24 MED ORDER — DEXMEDETOMIDINE HCL IN NACL 80 MCG/20ML IV SOLN
INTRAVENOUS | Status: DC | PRN
  Administered 2024-04-24: 8 ug via INTRAVENOUS

## 2024-04-24 MED ORDER — BUPIVACAINE HCL (PF) 0.5 % IJ SOLN
INTRAMUSCULAR | Status: DC | PRN
Start: 2024-04-24 — End: 2024-04-24
  Administered 2024-04-24: 10 mL via PERINEURAL

## 2024-04-24 MED ORDER — ALBUTEROL SULFATE HFA 108 (90 BASE) MCG/ACT IN AERS
INHALATION_SPRAY | RESPIRATORY_TRACT | Status: AC
  Filled 2024-04-24: qty 6.7

## 2024-04-24 MED ORDER — DEXAMETHASONE SODIUM PHOSPHATE 4 MG/ML IJ SOLN
INTRAMUSCULAR | Status: DC | PRN
  Administered 2024-04-24: 5 mg via INTRAVENOUS

## 2024-04-24 MED ORDER — PROPOFOL 500 MG/50ML IV EMUL
INTRAVENOUS | Status: AC
  Filled 2024-04-24: qty 50

## 2024-04-24 MED ORDER — VANCOMYCIN HCL 1000 MG IV SOLR
INTRAVENOUS | Status: DC | PRN
  Administered 2024-04-24: 1000 mg via TOPICAL

## 2024-04-24 MED ORDER — ACETAMINOPHEN 10 MG/ML IV SOLN: 1000.0000 mg | Freq: Once | INTRAVENOUS | Status: DC | PRN

## 2024-04-24 MED ORDER — TRANEXAMIC ACID-NACL 1000-0.7 MG/100ML-% IV SOLN
INTRAVENOUS | Status: AC
  Filled 2024-04-24: qty 100

## 2024-04-24 MED ORDER — ACETAMINOPHEN 500 MG PO TABS
1000.0000 mg | ORAL_TABLET | Freq: Three times a day (TID) | ORAL | 0 refills | Status: AC
Start: 2024-04-24 — End: 2024-05-08

## 2024-04-24 MED ORDER — PROPOFOL 10 MG/ML IV BOLUS
INTRAVENOUS | Status: DC | PRN
  Administered 2024-04-24: 130 mg via INTRAVENOUS

## 2024-04-24 MED ORDER — ACETAMINOPHEN 500 MG PO TABS
1000.0000 mg | ORAL_TABLET | Freq: Once | ORAL | Status: AC
Start: 2024-04-24 — End: 2024-04-24
  Administered 2024-04-24: 1000 mg via ORAL

## 2024-04-24 MED ORDER — TRANEXAMIC ACID-NACL 1000-0.7 MG/100ML-% IV SOLN
1000.0000 mg | INTRAVENOUS | Status: AC
  Administered 2024-04-24: 1000 mg via INTRAVENOUS

## 2024-04-24 MED ORDER — OXYCODONE HCL 5 MG/5ML PO SOLN: 5.0000 mg | Freq: Once | ORAL | Status: DC | PRN

## 2024-04-24 MED ORDER — PHENYLEPHRINE HCL (PRESSORS) 10 MG/ML IV SOLN
INTRAVENOUS | Status: DC | PRN
  Administered 2024-04-24 (×5): 80 ug via INTRAVENOUS

## 2024-04-24 MED ORDER — OXYCODONE HCL 5 MG PO TABS: ORAL_TABLET | ORAL | 0 refills | Status: AC

## 2024-04-24 MED ORDER — FENTANYL CITRATE (PF) 100 MCG/2ML IJ SOLN
INTRAMUSCULAR | Status: DC | PRN
  Administered 2024-04-24: 50 ug via INTRAVENOUS

## 2024-04-24 MED ORDER — GABAPENTIN 300 MG PO CAPS
ORAL_CAPSULE | ORAL | Status: AC
  Filled 2024-04-24: qty 1

## 2024-04-24 MED ORDER — MIDAZOLAM HCL 2 MG/2ML IJ SOLN
INTRAMUSCULAR | Status: AC
  Filled 2024-04-24: qty 2

## 2024-04-24 MED ORDER — SUGAMMADEX SODIUM 200 MG/2ML IV SOLN
INTRAVENOUS | Status: DC | PRN
  Administered 2024-04-24: 160 mg via INTRAVENOUS

## 2024-04-24 MED ORDER — BUPIVACAINE LIPOSOME 1.3 % IJ SUSP
INTRAMUSCULAR | Status: DC | PRN
Start: 2024-04-24 — End: 2024-04-24
  Administered 2024-04-24: 10 mL via PERINEURAL

## 2024-04-24 MED ORDER — OXYCODONE HCL 5 MG PO TABS: 5.0000 mg | ORAL_TABLET | Freq: Once | ORAL | Status: DC | PRN

## 2024-04-24 MED ORDER — CEFAZOLIN SODIUM-DEXTROSE 2-4 GM/100ML-% IV SOLN
INTRAVENOUS | Status: AC
  Filled 2024-04-24: qty 100

## 2024-04-24 MED ORDER — CEFAZOLIN SODIUM-DEXTROSE 2-4 GM/100ML-% IV SOLN
2.0000 g | INTRAVENOUS | Status: AC
  Administered 2024-04-24: 2 g via INTRAVENOUS

## 2024-04-24 MED ORDER — FENTANYL CITRATE (PF) 100 MCG/2ML IJ SOLN
INTRAMUSCULAR | Status: AC
Start: 2024-04-24 — End: 2024-04-24
  Filled 2024-04-24: qty 2

## 2024-04-24 MED ORDER — LACTATED RINGERS IV SOLN: INTRAVENOUS | Status: DC

## 2024-04-24 SURGICAL SUPPLY — 54 items
AUGMENT BASEPLATE 15DEG 25 WDG (Joint) IMPLANT
BIT DRILL 3.2 PERIPHERAL SCREW (BIT) IMPLANT
BLADE SAW SGTL 73X25 THK (BLADE) ×1 IMPLANT
BLADE SURG 10 STRL SS (BLADE) IMPLANT
BLADE SURG 15 STRL LF DISP TIS (BLADE) IMPLANT
BRUSH SCRUB EZ PLAIN DRY (MISCELLANEOUS) ×1 IMPLANT
CHLORAPREP W/TINT 26 (MISCELLANEOUS) ×1 IMPLANT
CLSR STERI-STRIP ANTIMIC 1/2X4 (GAUZE/BANDAGES/DRESSINGS) ×1 IMPLANT
COOLER ICEMAN CLASSIC (MISCELLANEOUS) ×1 IMPLANT
COVER BACK TABLE 60X90IN (DRAPES) ×1 IMPLANT
COVER MAYO STAND STRL (DRAPES) ×1 IMPLANT
DRAPE IMP U-DRAPE 54X76 (DRAPES) IMPLANT
DRAPE INCISE IOBAN 66X45 STRL (DRAPES) ×1 IMPLANT
DRAPE POUCH INSTRU U-SHP 10X18 (DRAPES) ×1 IMPLANT
DRAPE U-SHAPE 76X120 STRL (DRAPES) ×2 IMPLANT
DRSG AQUACEL AG ADV 3.5X 6 (GAUZE/BANDAGES/DRESSINGS) ×1 IMPLANT
ELECTRODE BLDE 4.0 EZ CLN MEGD (MISCELLANEOUS) ×1 IMPLANT
ELECTRODE REM PT RTRN 9FT ADLT (ELECTROSURGICAL) ×1 IMPLANT
FACESHIELD WRAPAROUND OR TEAM (MASK) ×2 IMPLANT
GAUZE XEROFORM 1X8 LF (GAUZE/BANDAGES/DRESSINGS) IMPLANT
GLENOSPHERE REV SHOULDER 36 (Joint) IMPLANT
GLOVE BIO SURGEON STRL SZ 6.5 (GLOVE) ×2 IMPLANT
GLOVE BIOGEL PI IND STRL 6.5 (GLOVE) ×1 IMPLANT
GLOVE BIOGEL PI IND STRL 8 (GLOVE) ×1 IMPLANT
GLOVE ECLIPSE 8.0 STRL XLNG CF (GLOVE) ×2 IMPLANT
GOWN STRL REUS W/ TWL LRG LVL3 (GOWN DISPOSABLE) ×2 IMPLANT
GOWN STRL REUS W/TWL XL LVL3 (GOWN DISPOSABLE) ×1 IMPLANT
GUIDEWIRE GLENOID 2.5X220 (WIRE) IMPLANT
INSERT REVERSED HUMERAL SIZE 1 (Orthopedic Implant) IMPLANT
KIT STABILIZATION SHOULDER (MISCELLANEOUS) ×1 IMPLANT
MANIFOLD NEPTUNE II (INSTRUMENTS) ×1 IMPLANT
PACK BASIN DAY SURGERY FS (CUSTOM PROCEDURE TRAY) ×1 IMPLANT
PACK SHOULDER (CUSTOM PROCEDURE TRAY) ×1 IMPLANT
PAD COLD SHLDR WRAP-ON (PAD) ×1 IMPLANT
PIN GUIDE 3X75 SHOULDER (PIN) IMPLANT
RESTRAINT HEAD UNIVERSAL NS (MISCELLANEOUS) ×1 IMPLANT
SCREW 5.5X22 (Screw) IMPLANT
SCREW BONE 6.5X40 SM (Screw) IMPLANT
SCREW PERIPHERAL 30 (Screw) IMPLANT
SET HNDPC FAN SPRY TIP SCT (DISPOSABLE) ×1 IMPLANT
SHEET MEDIUM DRAPE 40X70 STRL (DRAPES) ×1 IMPLANT
SLEEVE SCD COMPRESS KNEE MED (STOCKING) ×1 IMPLANT
SPIKE FLUID TRANSFER (MISCELLANEOUS) IMPLANT
SPONGE T-LAP 18X18 ~~LOC~~+RFID (SPONGE) ×1 IMPLANT
STEM HUMERAL PLUS LONG SZ2 (Orthopedic Implant) IMPLANT
SUT ETHIBOND 2 V 37 (SUTURE) ×1 IMPLANT
SUT ETHIBOND NAB CT1 #1 30IN (SUTURE) ×1 IMPLANT
SUT ETHILON 3 0 PS 1 (SUTURE) IMPLANT
SUT MNCRL AB 4-0 PS2 18 (SUTURE) ×1 IMPLANT
SUT VIC AB 0 CT1 27XBRD ANBCTR (SUTURE) IMPLANT
SUT VIC AB 3-0 SH 27X BRD (SUTURE) ×1 IMPLANT
SUTURE FIBERWR #5 38 CONV NDL (SUTURE) ×2 IMPLANT
TOWEL GREEN STERILE FF (TOWEL DISPOSABLE) ×3 IMPLANT
TUBE SUCTION HIGH CAP CLEAR NV (SUCTIONS) ×1 IMPLANT

## 2024-04-24 NOTE — Interval H&P Note (Signed)
 All questions answered, patient wants to proceed with procedure. ? ?

## 2024-04-24 NOTE — Anesthesia Procedure Notes (Signed)
 Procedure Name: Intubation Date/Time: 04/24/2024 8:09 AM  Performed by: Donnell Berwyn SQUIBB, CRNAPre-anesthesia Checklist: Patient identified, Emergency Drugs available, Suction available, Patient being monitored and Timeout performed Patient Re-evaluated:Patient Re-evaluated prior to induction Oxygen Delivery Method: Circle system utilized Preoxygenation: Pre-oxygenation with 100% oxygen Induction Type: IV induction Ventilation: Mask ventilation without difficulty Laryngoscope Size: Glidescope and 3 Grade View: Grade I Tube type: Oral Tube size: 7.0 mm Number of attempts: 1 Airway Equipment and Method: Stylet Placement Confirmation: ETT inserted through vocal cords under direct vision, positive ETCO2 and breath sounds checked- equal and bilateral Secured at: 21 cm Tube secured with: Tape Dental Injury: Teeth and Oropharynx as per pre-operative assessment  Difficulty Due To: Difficulty was anticipated

## 2024-04-24 NOTE — Progress Notes (Signed)
Assisted Dr. Valma Cava with right, interscalene , ultrasound guided block. Side rails up, monitors on throughout procedure. See vital signs in flow sheet. Tolerated Procedure well.

## 2024-04-24 NOTE — Transfer of Care (Signed)
 Immediate Anesthesia Transfer of Care Note  Patient: Christina Whitehead  Procedure(s) Performed: ARTHROPLASTY, SHOULDER, TOTAL, REVERSE (Right: Shoulder)  Patient Location: PACU  Anesthesia Type:General  Level of Consciousness: awake, alert , oriented, and patient cooperative  Airway & Oxygen Therapy: Patient Spontanous Breathing and Patient connected to nasal cannula oxygen  Post-op Assessment: Report given to RN and Post -op Vital signs reviewed and stable  Post vital signs: Reviewed and stable  Last Vitals:  Vitals Value Taken Time  BP 117/66 04/24/24 09:19  Temp    Pulse 78 04/24/24 09:23  Resp 18 04/24/24 09:23  SpO2 95 % 04/24/24 09:23  Vitals shown include unfiled device data.  Last Pain:  Vitals:   04/24/24 0633  TempSrc: Temporal  PainSc: 6       Patients Stated Pain Goal: 3 (04/24/24 9366)  Complications: No notable events documented.

## 2024-04-24 NOTE — Anesthesia Postprocedure Evaluation (Signed)
 Anesthesia Post Note  Patient: MABREY HOWLAND  Procedure(s) Performed: ARTHROPLASTY, SHOULDER, TOTAL, REVERSE (Right: Shoulder)     Patient location during evaluation: PACU Anesthesia Type: Regional and General Level of consciousness: awake and alert Pain management: pain level controlled Vital Signs Assessment: post-procedure vital signs reviewed and stable Respiratory status: spontaneous breathing, nonlabored ventilation, respiratory function stable and patient connected to nasal cannula oxygen Cardiovascular status: blood pressure returned to baseline and stable Postop Assessment: no apparent nausea or vomiting Anesthetic complications: no Comments: Pt Skin very thin.  Pt skin tore with purple site marker  and then again when IV removed in Pacu. Site cleaned with soap and water gently Telfa and Koban applied before discharge.   No notable events documented.  Last Vitals:  Vitals:   04/24/24 1044 04/24/24 1045  BP:  101/60  Pulse: 81 80  Resp:    Temp:    SpO2: 92%     Last Pain:  Vitals:   04/24/24 1024  TempSrc: Temporal  PainSc: 0-No pain                 Garnette DELENA Gab

## 2024-04-24 NOTE — Op Note (Signed)
 Orthopaedic Surgery Operative Note (CSN: [id number])  Christina Whitehead  21-Mar-1956 Date of Surgery: 04/24/2024   Diagnoses:  Right shoulder irreparable rotator cuff tear  Procedure: Right reverse augmented total Shoulder Arthroplasty   Operative Finding Successful completion of planned procedure.  Bone quality and tissue quality was quite poor in setting of previous history of smoking.  That said we had good stability of her implants.  Tug test normal.  Post-operative plan: The patient will be NWB in sling.  The patient will be will be discharged from PACU if continues to be stable as was plan prior to surgery.  DVT prophylaxis Aspirin  81 mg twice daily for 6 weeks.  Pain control with PRN pain medication preferring oral medicines.  Follow up plan will be scheduled in approximately 7 days for incision check and XR.  Physical therapy to start after discharge.  Implants: Tornier perform humeral size 2+ long stem, 0 polyethylene, 36 standard glenosphere, 25 full wedge baseplate with a 40 center screw and 4 peripheral screws  Post-Op Diagnosis: Same Surgeons:Primary: Cristy Bonner DASEN, MD Assistants:Caroline McBane, PA-C Location: [facility] OR ROOM 6 Anesthesia: General with Exparel  Interscalene Antibiotics: Ancef  2g preop, Vancomycin  1000mg  locally Tourniquet time: None Estimated Blood Loss: 100 Complications: None Specimens: None Implants: Implant Name Type Inv. Item Serial No. Manufacturer Lot No. LRB No. Used Action  AUGMENT BASEPLATE 15DEG 25 WDG - [id number] Joint AUGMENT BASEPLATE 15DEG 25 WDG [id number] [employer]  Right 1 Implanted  GLENOSPHERE REV SHOULDER 36 - [id number] Joint GLENOSPHERE REV SHOULDER 36 [id number] [employer]  Right 1 Implanted  SCREW BONE 6.5X40 SM - [id number] Screw SCREW BONE 6.5X40 SM  [employer] ON STERILE TRAY Right 1 Implanted  SCREW PERIPHERAL 30 - [id number] Screw SCREW PERIPHERAL 30  [employer] ON STERILE TRAY Right 2 Implanted  SCREW 5.5X22 - [id number] Screw  SCREW 5.5X22  [employer] ON STERILE TRAY Right 2 Implanted  STEM HUMERAL PLUS LONG SZ2 - [id number] Orthopedic Implant STEM HUMERAL PLUS LONG SZ2 [id number] [employer]  Right 1 Implanted  INSERT REVERSED HUMERAL SIZE 1 - [id number] Orthopedic Implant INSERT REVERSED HUMERAL SIZE 1 [id number] [employer]  Right 1 Implanted    Indications for Surgery:   Christina Whitehead is a 68 y.o. female with irreparable rotator cuff tear.  Benefits and risks of operative and nonoperative management were discussed prior to surgery with patient/guardian(s) and informed consent form was completed.  Infection and need for further surgery were discussed as was prosthetic stability and cuff issues.  We additionally specifically discussed risks of axillary nerve injury, infection, periprosthetic fracture, continued pain and longevity of implants prior to beginning procedure.      Procedure:   The patient was identified in the preoperative holding area where the surgical site was marked. Block placed by anesthesia with exparel .  The patient was taken to the OR where a procedural timeout was called and the above noted anesthesia was induced.  The patient was positioned beachchair on allen table with spider arm positioner.  Preoperative antibiotics were dosed.  The patient's right shoulder was prepped and draped in the usual sterile fashion.  A second preoperative timeout was called.       Standard deltopectoral approach was performed with a #10 blade. We dissected down to the subcutaneous tissues and the cephalic vein was taken laterally with the deltoid. Clavipectoral fascia was incised in line with the incision. Deep retractors were placed. The long of the biceps tendon was identified and there was significant  tenosynovitis present.  Tenodesis was performed to the pectoralis tendon with #2 Ethibond. The remaining biceps was followed up into the rotator interval where it was released.   The subscapularis was taken down in a  full thickness layer with capsule along the humeral neck extending inferiorly around the humeral head. We continued releasing the capsule directly off of the osteophytes inferiorly all the way around the corner. This allowed us  to dislocate the humeral head.    The rotator cuff was carefully examined and noted to be irreperably torn.  The decision was confirmed that a reverse total shoulder was indicated for this patient.  There were osteophytes along the inferior humeral neck. The osteophytes were removed with an osteotome and a rongeur.  Osteophytes were removed with a rongeur and an osteotome and the anatomic neck was well visualized.     A humeral cutting guide was used extra medullary with a pin to help control version. The version was set at 20 of retroversion. Humeral osteotomy was performed with an oscillating saw. The head fragment was passed off the back table.  A cut protector plate was placed.  The subscapularis was again identified and immediately we took care to palpate the axillary nerve anteriorly and verify its position with gentle palpation as well as the tug test.  We then released the SGHL with bovie cautery prior to placing a curved mayo at the junction of the anterior glenoid well above the axillary nerve and bluntly dissecting the subscapularis from the capsule.  We then carefully protected the axillary nerve as we gently released the inferior capsule to fully mobilize the subscapularis.  An anterior deltoid retractor was then placed as well as a small Hohmann retractor superiorly.    The remaining labrum was removed circumferentially taking great care not to disrupt the posterior capsule.   At this point we felt based on blueprint templating that a full wedge augment was necessary.  We began by using a full wedge guide to place our center pin as was templated.  We had good position of this pin and we proceeded with our starter center drill.  This allowed for us  to use the 15  degree full wedge reamer obtaining circumferential witness marks and good bone preparation for ingrowth.  At this point we proceeded with our center drill and had an intact vault.  We then drilled our center screw to a length of 40 mm.    We selected a 6.5 mm x 40 mm screw and the full wedge baseplate which was placed in the same orientation as our reaming.  We double checked that we had good apposition of the base plate to bone and then proceeded to place 3 locking screws and one nonlocking screw as is typical.   Next a 36 mm glenosphere was selected and impacted onto the baseplate. The center screw was tightened.  We turned attention back to the humeral side. The cut protector was removed.  We used the perform humeral sizing block to select the appropriate size which for this patient was a 2.  We then placed our center pin and reamed over it concentrically obtaining appropriate inset.  We then used our lateralizing chisel to prepare the lateral aspect of the humerus.  At that point we selected the appropriate implant trialing a 2 long.  Using this trial implant we trialed multiple polyethylene sizes settling on a 0 which provided good stability and range of motion without excess soft tissue tension. The offset was dialed  in to match the normal anatomy. The shoulder was trialed.  There was good ROM in all planes and the shoulder was stable with no inferior translation.  The real humeral implants were opened after again confirming sizes.  The trial was removed. #5 Fiberwire x4 sutures passed through the humeral neck for subscap repair. The humeral component was press-fit obtaining a secure fit. The joint was reduced and thoroughly irrigated with pulsatile lavage. Subscap was repaired back with #5 Fiberwire sutures through bone tunnels. Hemostasis was obtained. The deltopectoral interval was reapproximated with #1 Ethibond. The subcutaneous tissues were closed with 2-0 Vicryl and the skin was closed with  running monocryl.    The wounds were cleaned and dried and an Aquacel dressing was placed. The drapes taken down. The arm was placed into sling with abduction pillow. Patient was awakened, extubated, and transferred to the recovery room in stable condition. There were no intraoperative complications. The sponge, needle, and attention counts were  correct at the end of the case.     Aleck Stalling, PA-C, present and scrubbed throughout the case, critical for completion in a timely fashion, and for retraction, instrumentation, closure.

## 2024-04-24 NOTE — Anesthesia Procedure Notes (Signed)
 Anesthesia Regional Block: Interscalene brachial plexus block   Pre-Anesthetic Checklist: , timeout performed,  Correct Patient, Correct Site, Correct Laterality,  Correct Procedure, Correct Position, site marked,  Risks and benefits discussed,  Surgical consent,  Pre-op evaluation,  At surgeon's request and post-op pain management  Laterality: Upper and Right  Prep: Maximum Sterile Barrier Precautions used, chloraprep       Needles:  Injection technique: Single-shot  Needle Type: Echogenic Needle     Needle Length: 5cm  Needle Gauge: 21     Additional Needles:   Procedures:,,,, ultrasound used (permanent image in chart),,    Narrative:  Start time: 04/25/2024 7:00 AM End time: 04/24/2024 7:05 AM Injection made incrementally with aspirations every 5 mL.  Performed by: Personally  Anesthesiologist: Jefm Garnette LABOR, MD  Additional Notes: Block assessed prior to procedure. Patient tolerated procedure well.

## 2024-04-25 ENCOUNTER — Encounter (HOSPITAL_BASED_OUTPATIENT_CLINIC_OR_DEPARTMENT_OTHER): Payer: Self-pay | Admitting: Orthopaedic Surgery

## 2024-04-25 NOTE — Addendum Note (Signed)
 Addendum  created 04/25/24 1128 by Jefm Garnette LABOR, MD   Clinical Note Signed, Intraprocedure Blocks edited, SmartForm saved

## 2024-08-04 ENCOUNTER — Other Ambulatory Visit (HOSPITAL_COMMUNITY): Payer: Self-pay

## 2024-09-07 ENCOUNTER — Encounter
